# Patient Record
Sex: Female | Born: 1935 | Race: White | Hispanic: No | Smoking: Former smoker
Health system: Southern US, Community
[De-identification: ages and names within clinical notes are randomized; demographics above are authoritative.]

## PROBLEM LIST (undated history)

## (undated) DIAGNOSIS — E7800 Pure hypercholesterolemia, unspecified: Secondary | ICD-10-CM

## (undated) DIAGNOSIS — Z720 Tobacco use: Secondary | ICD-10-CM

## (undated) DIAGNOSIS — Z716 Tobacco abuse counseling: Secondary | ICD-10-CM

## (undated) DIAGNOSIS — K5792 Diverticulitis of intestine, part unspecified, without perforation or abscess without bleeding: Secondary | ICD-10-CM

## (undated) DIAGNOSIS — I10 Essential (primary) hypertension: Secondary | ICD-10-CM

## (undated) DIAGNOSIS — C801 Malignant (primary) neoplasm, unspecified: Secondary | ICD-10-CM

## (undated) DIAGNOSIS — K922 Gastrointestinal hemorrhage, unspecified: Secondary | ICD-10-CM

## (undated) HISTORY — DX: Pure hypercholesterolemia, unspecified: E78.00

## (undated) HISTORY — DX: Tobacco use: Z72.0

## (undated) HISTORY — DX: Gastrointestinal hemorrhage, unspecified: K92.2

## (undated) HISTORY — PX: APPENDECTOMY: SHX54

## (undated) HISTORY — DX: Essential (primary) hypertension: I10

## (undated) HISTORY — DX: Tobacco abuse counseling: Z71.6

## (undated) HISTORY — DX: Diverticulitis of intestine, part unspecified, without perforation or abscess without bleeding: K57.92

---

## 2001-08-15 HISTORY — PX: INCONTINENCE SURGERY: SHX676

## 2003-08-16 DIAGNOSIS — K5792 Diverticulitis of intestine, part unspecified, without perforation or abscess without bleeding: Secondary | ICD-10-CM

## 2003-08-16 HISTORY — DX: Diverticulitis of intestine, part unspecified, without perforation or abscess without bleeding: K57.92

## 2004-09-23 ENCOUNTER — Ambulatory Visit: Payer: Self-pay | Admitting: Internal Medicine

## 2004-09-29 ENCOUNTER — Ambulatory Visit: Payer: Self-pay | Admitting: Internal Medicine

## 2006-05-30 ENCOUNTER — Other Ambulatory Visit: Payer: Self-pay

## 2006-05-30 ENCOUNTER — Emergency Department: Payer: Self-pay | Admitting: Emergency Medicine

## 2006-06-20 ENCOUNTER — Emergency Department: Payer: Self-pay | Admitting: Emergency Medicine

## 2008-05-01 ENCOUNTER — Other Ambulatory Visit: Payer: Self-pay

## 2008-05-01 ENCOUNTER — Inpatient Hospital Stay: Payer: Self-pay | Admitting: Internal Medicine

## 2008-06-15 DIAGNOSIS — K922 Gastrointestinal hemorrhage, unspecified: Secondary | ICD-10-CM

## 2008-06-15 HISTORY — DX: Gastrointestinal hemorrhage, unspecified: K92.2

## 2008-07-09 ENCOUNTER — Ambulatory Visit: Payer: Self-pay | Admitting: Unknown Physician Specialty

## 2009-05-13 ENCOUNTER — Ambulatory Visit: Payer: Self-pay | Admitting: Internal Medicine

## 2009-08-15 HISTORY — PX: CATARACT EXTRACTION: SUR2

## 2009-09-09 ENCOUNTER — Ambulatory Visit: Payer: Self-pay | Admitting: Ophthalmology

## 2010-05-06 LAB — HM PAP SMEAR: HM Pap smear: NORMAL

## 2010-06-16 ENCOUNTER — Ambulatory Visit: Payer: Self-pay | Admitting: Ophthalmology

## 2011-02-02 ENCOUNTER — Ambulatory Visit: Payer: Self-pay | Admitting: Internal Medicine

## 2011-04-25 ENCOUNTER — Telehealth: Payer: Self-pay | Admitting: Internal Medicine

## 2011-04-25 NOTE — Telephone Encounter (Signed)
Again, I have no records to review on her so I don't know what medications she is taking , but she should start taking a full strength aspirin daily.

## 2011-04-25 NOTE — Telephone Encounter (Signed)
Patient called and stated she had two episodes of "chest pressure"  Last Tuesday and then again on Saturday.  She stated it doesn't feel like heartburn, or an upper respiratory infection.  And she did not report any pain in her arms or anywhere else she just wanted to see you.  The soonest appt you have is on Thursday.  I advised patient to go to the ER if it happened again.  Is there anything she can do until the appt?  Please advise

## 2011-04-25 NOTE — Telephone Encounter (Signed)
Notified patient he do not have her records.  She stated she is not supposed to be taking any aspirin because of a bleeding ulcer.  I advised her again we did not have her records so keep the appt on Thursday.

## 2011-04-26 NOTE — Telephone Encounter (Signed)
There is nothing else I can prescribe without seeing her. She needs to go to the ER if she has another episode

## 2011-04-27 NOTE — Telephone Encounter (Signed)
Notified patient of the message, she will keep appt with you tomorrow.

## 2011-04-28 ENCOUNTER — Encounter: Payer: Self-pay | Admitting: Internal Medicine

## 2011-04-28 ENCOUNTER — Other Ambulatory Visit: Payer: Self-pay | Admitting: Cardiovascular Disease

## 2011-04-28 ENCOUNTER — Ambulatory Visit: Payer: Self-pay | Admitting: Internal Medicine

## 2011-04-28 ENCOUNTER — Ambulatory Visit (INDEPENDENT_AMBULATORY_CARE_PROVIDER_SITE_OTHER): Payer: Medicare Other | Admitting: Internal Medicine

## 2011-04-28 DIAGNOSIS — K5900 Constipation, unspecified: Secondary | ICD-10-CM

## 2011-04-28 DIAGNOSIS — R079 Chest pain, unspecified: Secondary | ICD-10-CM | POA: Insufficient documentation

## 2011-04-28 DIAGNOSIS — O169 Unspecified maternal hypertension, unspecified trimester: Secondary | ICD-10-CM

## 2011-04-28 DIAGNOSIS — Z720 Tobacco use: Secondary | ICD-10-CM

## 2011-04-28 DIAGNOSIS — K5790 Diverticulosis of intestine, part unspecified, without perforation or abscess without bleeding: Secondary | ICD-10-CM | POA: Insufficient documentation

## 2011-04-28 DIAGNOSIS — F411 Generalized anxiety disorder: Secondary | ICD-10-CM

## 2011-04-28 DIAGNOSIS — F17200 Nicotine dependence, unspecified, uncomplicated: Secondary | ICD-10-CM

## 2011-04-28 DIAGNOSIS — Z716 Tobacco abuse counseling: Secondary | ICD-10-CM

## 2011-04-28 DIAGNOSIS — M810 Age-related osteoporosis without current pathological fracture: Secondary | ICD-10-CM | POA: Insufficient documentation

## 2011-04-28 DIAGNOSIS — E785 Hyperlipidemia, unspecified: Secondary | ICD-10-CM

## 2011-04-28 DIAGNOSIS — K922 Gastrointestinal hemorrhage, unspecified: Secondary | ICD-10-CM | POA: Insufficient documentation

## 2011-04-28 DIAGNOSIS — H409 Unspecified glaucoma: Secondary | ICD-10-CM

## 2011-04-28 DIAGNOSIS — Z7189 Other specified counseling: Secondary | ICD-10-CM

## 2011-04-28 DIAGNOSIS — I10 Essential (primary) hypertension: Secondary | ICD-10-CM | POA: Insufficient documentation

## 2011-04-28 DIAGNOSIS — K5730 Diverticulosis of large intestine without perforation or abscess without bleeding: Secondary | ICD-10-CM

## 2011-04-28 LAB — LIPID PANEL: HDL: 38.5 mg/dL — ABNORMAL LOW (ref >39.00)

## 2011-04-28 LAB — COMPREHENSIVE METABOLIC PANEL
Albumin: 4.2 g/dL (ref 3.5–5.2)
BUN: 12 mg/dL (ref 6–23)
Calcium: 9.4 mg/dL (ref 8.4–10.5)
Chloride: 104 mEq/L (ref 96–112)
Glucose, Bld: 111 mg/dL — ABNORMAL HIGH (ref 70–99)
Potassium: 4.9 mEq/L (ref 3.5–5.1)

## 2011-04-28 MED ORDER — METOPROLOL TARTRATE 25 MG PO TABS: 25.0000 mg | ORAL_TABLET | Freq: Two times a day (BID) | ORAL | Status: DC

## 2011-04-28 NOTE — Patient Instructions (Signed)
Your chest pain is concerning for angina (heart pain).  I am prescribing a medicine called metoprolol to calm your heat down.  It will not relieve your pain.  If you have another episode you need to go to the ER.  But we are referring you to Dr. Mariah Milling for a cardiac test.

## 2011-04-28 NOTE — Progress Notes (Signed)
  Subjective:    Patient ID: Nancy Doyle, female    DOB: 09-29-1934, 76 y.o.   MRN: [id number]  HPI  [name] is a  76 yo w female with history of hyperlipidemia, htn and tobacco abuse presents with 2 discrete epsiodes of chest pressure, substernal,  lasting 20 minutes, both occurring with minimal exertion .  She denies dypsnea, diaphoresis, nausea or jaw pain. She had a stress test less than 10 yrs ago, an exercise treadmill, which was negative.    Review of Systems  Constitutional: Negative for fever, chills and unexpected weight change.  HENT: Negative for hearing loss, ear pain, nosebleeds, congestion, sore throat, facial swelling, rhinorrhea, sneezing, mouth sores, trouble swallowing, neck pain, neck stiffness, voice change, postnasal drip, sinus pressure, tinnitus and ear discharge.   Eyes: Negative for pain, discharge, redness and visual disturbance.  Respiratory: Negative for cough, chest tightness, shortness of breath, wheezing and stridor.   Cardiovascular: Negative for chest pain, palpitations and leg swelling.  Musculoskeletal: Negative for myalgias and arthralgias.  Skin: Negative for color change and rash.  Neurological: Negative for dizziness, weakness, light-headedness and headaches.  Hematological: Negative for adenopathy.       Objective:   Physical Exam  Constitutional: She is oriented to person, place, and time. She appears well-developed and well-nourished.  HENT:  Mouth/Throat: Oropharynx is clear and moist.  Eyes: EOM are normal. Pupils are equal, round, and reactive to light. No scleral icterus.  Neck: Normal range of motion. Neck supple. No JVD present. No thyromegaly present.  Cardiovascular: Normal rate, regular rhythm, normal heart sounds and intact distal pulses.   Pulmonary/Chest: Effort normal and breath sounds normal.  Abdominal: Soft. Bowel sounds are normal. She exhibits no mass. There is no tenderness.  Musculoskeletal: Normal range of motion. She  exhibits no edema.  Lymphadenopathy:    She has no cervical adenopathy.  Neurological: She is alert and oriented to person, place, and time.  Skin: Skin is warm and dry.  Psychiatric: She has a normal mood and affect.          Assessment & Plan:

## 2011-04-29 ENCOUNTER — Telehealth: Payer: Self-pay | Admitting: *Deleted

## 2011-04-29 ENCOUNTER — Encounter: Payer: Self-pay | Admitting: Internal Medicine

## 2011-04-29 DIAGNOSIS — F411 Generalized anxiety disorder: Secondary | ICD-10-CM | POA: Insufficient documentation

## 2011-04-29 DIAGNOSIS — K5900 Constipation, unspecified: Secondary | ICD-10-CM | POA: Insufficient documentation

## 2011-04-29 DIAGNOSIS — Z716 Tobacco abuse counseling: Secondary | ICD-10-CM | POA: Insufficient documentation

## 2011-04-29 DIAGNOSIS — Z720 Tobacco use: Secondary | ICD-10-CM | POA: Insufficient documentation

## 2011-04-29 DIAGNOSIS — H409 Unspecified glaucoma: Secondary | ICD-10-CM | POA: Insufficient documentation

## 2011-04-29 LAB — TROPONIN I: Troponin I: <0.01 ng/mL (ref <0.06)

## 2011-04-29 LAB — CK TOTAL AND CKMB (NOT AT ARMC): Total CK: 50 U/L (ref 7–177)

## 2011-04-29 NOTE — Assessment & Plan Note (Signed)
Discussed (again) her  Increased risks for CAD, CVA and CA with continued abuse.  At her age she remains uninterested in cessation.

## 2011-04-29 NOTE — Assessment & Plan Note (Signed)
Concerning for new onset angina.  Cardiac RFs include age, hypertension, tobacco abuse and untreated hyperliidemia per patient preference. Have discussed case with Dossie Arbour who will see her next week and stressher at [facility] prior to meeting.  She had a negative stress test 5 yrs ago by Dr. Welton Flakes, but records are unavailable at this time.  Starting a beta blocker.  I did not give her an rx SL NTG bc I she has RFs for AAA and has no prior workup for it per patient preference.  Instructed to go directl to ER for next occurrence of chest pain so she can be admitted for unstable angina.

## 2011-04-29 NOTE — Telephone Encounter (Signed)
Pt previously seen by Dr. Darrick Huntsman, per Dr. Mariah Milling, gated [facility] ordered at [facility]. I have notified pt procedure scheduled for Tues 05/03/11 @ 0700, pt will pre-register no later than day before. Gave instructions: NPO 4 hrs prior, no caffeine or decaf 12 hrs prior to test, pt will HOLD Amlodipine and Metoprolol evening prior and morning of test, but will take remaining Rx meds with sip of water that AM. Pt has no other questions at this time. Notified pt we will call her with results within 1-2 days after procedure.

## 2011-05-02 ENCOUNTER — Telehealth: Payer: Self-pay | Admitting: Internal Medicine

## 2011-05-02 NOTE — Telephone Encounter (Signed)
Patient called and stated the metoprolol you prescribed at her visit made her very sick she experienced vomiting the first time she took the medication.  She has not taken it since then, because she stated it also made her feel worse than she did before.  She goes for her stress test tomorrow.  Please advise if you want to change the medication.

## 2011-05-03 ENCOUNTER — Ambulatory Visit: Payer: Self-pay | Admitting: Cardiovascular Disease

## 2011-05-03 DIAGNOSIS — R079 Chest pain, unspecified: Secondary | ICD-10-CM

## 2011-05-03 NOTE — Telephone Encounter (Signed)
Tried calling patient x 3, but kept getting busy signal. Will try again later.

## 2011-05-03 NOTE — Telephone Encounter (Signed)
The stress test was done today and looked good! Normal EF. No large regions of ischemia thx Tim

## 2011-05-03 NOTE — Telephone Encounter (Signed)
No, that is un unusua reaction.  Do not take anymore.

## 2011-05-04 ENCOUNTER — Telehealth: Payer: Self-pay | Admitting: *Deleted

## 2011-05-04 ENCOUNTER — Other Ambulatory Visit (INDEPENDENT_AMBULATORY_CARE_PROVIDER_SITE_OTHER): Payer: Medicare Other | Admitting: *Deleted

## 2011-05-04 ENCOUNTER — Telehealth: Payer: Self-pay | Admitting: Internal Medicine

## 2011-05-04 DIAGNOSIS — N390 Urinary tract infection, site not specified: Secondary | ICD-10-CM

## 2011-05-04 LAB — POCT URINALYSIS DIPSTICK
Glucose, UA: NEGATIVE
Protein, UA: NEGATIVE
Urobilinogen, UA: 0.2

## 2011-05-04 NOTE — Telephone Encounter (Signed)
Pt thinks she has a uti or kidney infection.  She goes to the bathroom a lot and it hurts [facility] [place]

## 2011-05-04 NOTE — Telephone Encounter (Signed)
Her stress test was normal per Dr. Mariah Milling

## 2011-05-04 NOTE — Telephone Encounter (Signed)
Patient scheduled a lab appt for possible uti. I entered results in her chart. There was a large amount of blood in her urine so I also sent for culture.

## 2011-05-04 NOTE — Telephone Encounter (Signed)
Pt notified that Lexiscan was normal per Dr. Mariah Milling. She has f/u in office 05/06/11.

## 2011-05-05 ENCOUNTER — Other Ambulatory Visit: Payer: Self-pay | Admitting: *Deleted

## 2011-05-05 MED ORDER — CIPROFLOXACIN HCL 250 MG PO TABS: 250.0000 mg | ORAL_TABLET | Freq: Two times a day (BID) | ORAL | Status: AC

## 2011-05-05 NOTE — Telephone Encounter (Signed)
Patient was notified by Dr. Windell Hummingbird office of her stress test results

## 2011-05-05 NOTE — Telephone Encounter (Signed)
Called to verify which pharmacy she wanted cipro called in to.

## 2011-05-06 ENCOUNTER — Encounter: Payer: Self-pay | Admitting: Cardiovascular Disease

## 2011-05-06 ENCOUNTER — Ambulatory Visit (INDEPENDENT_AMBULATORY_CARE_PROVIDER_SITE_OTHER): Payer: Medicare Other | Admitting: Cardiovascular Disease

## 2011-05-06 DIAGNOSIS — Z720 Tobacco use: Secondary | ICD-10-CM

## 2011-05-06 DIAGNOSIS — E785 Hyperlipidemia, unspecified: Secondary | ICD-10-CM

## 2011-05-06 DIAGNOSIS — F17200 Nicotine dependence, unspecified, uncomplicated: Secondary | ICD-10-CM

## 2011-05-06 DIAGNOSIS — R079 Chest pain, unspecified: Secondary | ICD-10-CM

## 2011-05-06 NOTE — Assessment & Plan Note (Signed)
We did discuss her smoking with her. She is not interested in any medication options for smoking cessation at this time. She is not mentally ready. She knows the dangers including cancer, stroke and heart attack.

## 2011-05-06 NOTE — Assessment & Plan Note (Addendum)
We have suggested that if she has any further episodes of chest pain, that she contact our office or Dr. Darrick Huntsman. If symptoms are concerning, escalating, increasing in intensity, we would recommend a cardiac catheterization given her long smoking history. We did suggest that she consider starting aspirin 81 mg times one. She will discuss this with Dr. Darrick Huntsman. She does have a history of peptic ulcer disease/GERD.

## 2011-05-06 NOTE — Progress Notes (Signed)
Patient ID: Nancy Doyle, female    DOB: 1935-02-13, 76 y.o.   MRN: [id number]  HPI Comments: Ms. Ciaramitaro is a pleasant 76 year old woman with a long history of smoking "since she was 76 years old", hyperlipidemia, family history of stroke, with recent episodes of chest pain, workup including a Myoview stress test that showed no significant ischemia and normal ejection fraction. She presents to establish care in our office and for followup evaluation.  She reports that she has not had any further chest pain. She does not have shortness of breath, is active at baseline. Overall has no complaints. She did have occasional swelling in her legs but this resolves with leg elevation. Her mother did have venous insufficiency. She is not interested in a cholesterol medication for uncertain reasons. She reports that her previous episodes of chest pain happened while she was at rest.  She was unable to tolerate metoprolol as this caused nausea and malaise. She continues to smoke one pack per day.  EKG shows normal sinus rhythm with rate 80 beats per minute with no significant ST or T wave changes  Recent stress test performed at [facility] showed no significant ischemia   Outpatient Encounter Prescriptions as of 05/06/2011  Medication Sig Dispense Refill  . ALPRAZolam (XANAX) 0.5 MG tablet Take 0.5 mg by mouth 2 (two) times daily as needed.        Marland Kitchen amLODipine (NORVASC) 10 MG tablet Take 10 mg by mouth daily.        . Calcium Carbonate-Vitamin D (CALCIUM + D) 600-200 MG-UNIT TABS Take 1 tablet by mouth daily.        . ciprofloxacin (CIPRO) 250 MG tablet Take 1 tablet (250 mg total) by mouth 2 (two) times daily.  14 tablet  0  . multivitamin-iron-minerals-folic acid (CENTRUM) chewable tablet Chew 1 tablet by mouth daily.        Marland Kitchen omeprazole (PRILOSEC) 10 MG capsule Take 10 mg by mouth daily.        . polyethylene glycol (MIRALAX / GLYCOLAX) packet Take 17 g by mouth daily as needed.        . travoprost,  benzalkonium, (TRAVATAN) 0.004 % ophthalmic solution Place 1 drop into both eyes at bedtime.           Review of Systems  Constitutional: Negative.   HENT: Negative.   Eyes: Negative.   Respiratory: Negative.   Cardiovascular: Negative.   Gastrointestinal: Negative.   Musculoskeletal: Negative.   Skin: Negative.   Neurological: Negative.   Hematological: Negative.   Psychiatric/Behavioral: Negative.   All other systems reviewed and are negative.    BP 126/75  Pulse 84  Ht 5\' 6"  (1.676 m)  Wt 160 lb (72.576 kg)  BMI 25.82 kg/m2   Physical Exam  Nursing note and vitals reviewed. Constitutional: She is oriented to person, place, and time. She appears well-developed and well-nourished.  HENT:  Head: Normocephalic.  Nose: Nose normal.  Mouth/Throat: Oropharynx is clear and moist.  Eyes: Conjunctivae are normal. Pupils are equal, round, and reactive to light.  Neck: Normal range of motion. Neck supple. No JVD present.  Cardiovascular: Normal rate, regular rhythm, S1 normal, S2 normal, normal heart sounds and intact distal pulses.  Exam reveals no gallop and no friction rub.   No murmur heard. Pulmonary/Chest: Effort normal and breath sounds normal. No respiratory distress. She has no wheezes. She has no rales. She exhibits no tenderness.  Abdominal: Soft. Bowel sounds are normal. She exhibits no distension.  There is no tenderness.  Musculoskeletal: Normal range of motion. She exhibits no edema and no tenderness.  Lymphadenopathy:    She has no cervical adenopathy.  Neurological: She is alert and oriented to person, place, and time. Coordination normal.  Skin: Skin is warm and dry. No rash noted. No erythema.  Psychiatric: She has a normal mood and affect. Her behavior is normal. Judgment and thought content normal.         Assessment and Plan

## 2011-05-06 NOTE — Assessment & Plan Note (Signed)
We did discuss her cholesterol with her. She does not want a cholesterol medication at this time. We did suggest she try red yeast rice, 2 tablets, possibly titrating to 4 tablets a day.

## 2011-05-06 NOTE — Patient Instructions (Signed)
You are doing well. Consider trying red yeast rice. Consider taking aspirin 81 mg daily Please call us if you have new issues that need to be addressed before your next appt.  We will call you for a follow up Appt. In 12 months

## 2011-05-07 LAB — URINE CULTURE

## 2011-05-12 ENCOUNTER — Encounter: Payer: Self-pay | Admitting: Cardiovascular Disease

## 2011-05-27 ENCOUNTER — Encounter: Payer: Self-pay | Admitting: Internal Medicine

## 2011-08-19 ENCOUNTER — Telehealth: Payer: Self-pay | Admitting: Internal Medicine

## 2011-08-19 DIAGNOSIS — Z1239 Encounter for other screening for malignant neoplasm of breast: Secondary | ICD-10-CM

## 2011-08-19 NOTE — Telephone Encounter (Signed)
Should be on printer 

## 2011-08-19 NOTE — Telephone Encounter (Signed)
[phone] Pt called wanted to get order for mammogram  She goes to [facility] Its been over a year.   Pt would like after 9 appointment

## 2011-08-22 ENCOUNTER — Other Ambulatory Visit: Payer: Self-pay | Admitting: *Deleted

## 2011-08-22 DIAGNOSIS — O169 Unspecified maternal hypertension, unspecified trimester: Secondary | ICD-10-CM

## 2011-08-22 MED ORDER — AMLODIPINE BESYLATE 10 MG PO TABS: 10.0000 mg | ORAL_TABLET | Freq: Every day | ORAL | Status: DC

## 2011-08-22 MED ORDER — HYDROCODONE-ACETAMINOPHEN 7.5-750 MG PO TABS: ORAL_TABLET | ORAL | Status: DC

## 2011-08-22 NOTE — Telephone Encounter (Signed)
Ok to refill vicodin  #30 with 3 refills

## 2011-08-22 NOTE — Telephone Encounter (Signed)
Medication called to pharmacy. 

## 2011-08-22 NOTE — Telephone Encounter (Signed)
Nancy Doyle is setting up this mammogram for patient.

## 2011-08-22 NOTE — Telephone Encounter (Signed)
Pt requests refill on hydrocodone, [facility] [place].

## 2011-10-06 ENCOUNTER — Ambulatory Visit: Payer: Self-pay | Admitting: Internal Medicine

## 2011-10-24 ENCOUNTER — Encounter: Payer: Self-pay | Admitting: Internal Medicine

## 2011-10-31 ENCOUNTER — Other Ambulatory Visit: Payer: Self-pay | Admitting: Internal Medicine

## 2011-10-31 DIAGNOSIS — F411 Generalized anxiety disorder: Secondary | ICD-10-CM

## 2011-10-31 NOTE — Telephone Encounter (Signed)
[phone] Pt called to get her alprazolam refilled pt is out of meds [facility] [place]

## 2011-11-02 MED ORDER — ALPRAZOLAM 0.5 MG PO TABS: 0.5000 mg | ORAL_TABLET | Freq: Two times a day (BID) | ORAL | Status: DC | PRN

## 2011-11-25 ENCOUNTER — Telehealth: Payer: Self-pay | Admitting: Internal Medicine

## 2011-11-25 NOTE — Telephone Encounter (Signed)
Patient notified

## 2011-11-25 NOTE — Telephone Encounter (Signed)
Caller: Opie/Patient; PCP: Duncan Dull; CB#: [phone];  Call regarding feeling like she is going to pass out and sweating.; Pt felting faint after mowing on 11/28/11. Had to lay her head down on the table for 30 mins, unsure if she had LOC. She had a similar episode x 2 wks ago that occured while sitting. Felt faint and flushed, no LOC. Pt is asymptomatic today. Appt sched for 11/29/11 @ 1000 with Dr. Darrick Huntsman per Fainting Protocol. Call back parameters reviewed.

## 2011-11-25 NOTE — Telephone Encounter (Signed)
Please ackmnowledge patients appt for 4/16 and tell her to avoid strenuous activity (no more mowing) and to increase her fluid intake. NOt sure from the triage note when the sympomts occurred in relation to mowing (4/15?? That date is in the future) so she might be dehydrated.   If it happens again before th 16th she should call 911

## 2011-11-29 ENCOUNTER — Encounter: Payer: Self-pay | Admitting: Internal Medicine

## 2011-11-29 ENCOUNTER — Ambulatory Visit (INDEPENDENT_AMBULATORY_CARE_PROVIDER_SITE_OTHER): Payer: Medicare Other | Admitting: Internal Medicine

## 2011-11-29 VITALS — BP 130/66 | HR 75 | Temp 98.4°F | Resp 18 | Wt 157.8 lb

## 2011-11-29 DIAGNOSIS — Z720 Tobacco use: Secondary | ICD-10-CM

## 2011-11-29 DIAGNOSIS — E039 Hypothyroidism, unspecified: Secondary | ICD-10-CM

## 2011-11-29 DIAGNOSIS — R55 Syncope and collapse: Secondary | ICD-10-CM

## 2011-11-29 DIAGNOSIS — K922 Gastrointestinal hemorrhage, unspecified: Secondary | ICD-10-CM

## 2011-11-29 DIAGNOSIS — F17200 Nicotine dependence, unspecified, uncomplicated: Secondary | ICD-10-CM

## 2011-11-29 LAB — CBC WITH DIFFERENTIAL/PLATELET
Basophils Absolute: 0 10*3/uL (ref 0.0–0.1)
Hemoglobin: 14.2 g/dL (ref 12.0–15.0)
Lymphocytes Relative: 29.2 % (ref 12.0–46.0)
Monocytes Relative: 14.8 % — ABNORMAL HIGH (ref 3.0–12.0)
Neutro Abs: 3.8 10*3/uL (ref 1.4–7.7)
RBC: 4.64 Mil/uL (ref 3.87–5.11)
RDW: 15.4 % — ABNORMAL HIGH (ref 11.5–14.6)

## 2011-11-29 LAB — COMPLETE METABOLIC PANEL WITH GFR
ALT: 8 U/L (ref 0–35)
Alkaline Phosphatase: 86 U/L (ref 39–117)
Creat: 0.66 mg/dL (ref 0.50–1.10)
GFR, Est African American: >89 mL/min
GFR, Est Non African American: 87 mL/min
Glucose, Bld: 92 mg/dL (ref 70–99)
Sodium: 140 mEq/L (ref 135–145)
Total Bilirubin: 0.4 mg/dL (ref 0.3–1.2)
Total Protein: 7.2 g/dL (ref 6.0–8.3)

## 2011-11-29 LAB — TSH: TSH: 1.79 u[IU]/mL (ref 0.35–5.50)

## 2011-11-29 NOTE — Patient Instructions (Signed)
You appear to be dehydrated,  Which  can cause you to faint.  Please increase your water and salt intaek for the next few days until we can result your labs.  The next time you feel this way,  Lie down immediately, or call 911 if you are having chest pain or shortness of breath with it

## 2011-11-29 NOTE — Progress Notes (Signed)
Patient ID: Nancy Doyle, female   DOB: 1935-04-21, 77 y.o.   MRN: [id number]  Patient Active Problem List  Diagnoses  . Chest pain  . Other and unspecified hyperlipidemia  . Hypertension in antepartum pregnancy  . Diverticulosis  . Upper GI bleed  . Osteoporosis  . Tobacco abuse  . Tobacco abuse counseling  . Constipation  . Generalized anxiety disorder  . Glaucoma  . Near syncope    Subjective:  CC:   Chief Complaint  Patient presents with  . Loss of Consciousness    2 episodes, almost passed out    HPI:   Nancy Doyle a 77 y.o. female who presents  Past Medical History  Diagnosis Date  . GI bleed Nov 2009    3  ulcers found, required transfusion, repeat endoscopy showed mild gastritis  . Hypercholesterolemia     refuses statin therapy  . Diverticulitis 2005    hospitalized  . Hypertension   . Osteoporosis   . Tobacco abuse   . Tobacco abuse counseling     Past Surgical History  Procedure Date  . Incontinence surgery 2003    Dr. Barnabas Lister  . Appendectomy Age 23  . Cataract extraction Jan 2011    Dr. Inez Pilgrim         The following portions of the patient's history were reviewed and updated as appropriate: Allergies, current medications, and problem list.    Review of Systems:   12 Pt  review of systems was negative except those addressed in the HPI,     History   Social History  . Marital Status: Single    Spouse Name: N/A    Number of Children: N/A  . Years of Education: N/A   Occupational History  . Retired    Social History Main Topics  . Smoking status: Current Everyday Smoker -- 1.0 packs/day    Types: Cigarettes  . Smokeless tobacco: Never Used  . Alcohol Use: No  . Drug Use: No  . Sexually Active: Not on file   Other Topics Concern  . Not on file   Social History Narrative  . No narrative on file    Objective:  BP 130/66  Pulse 75  Temp(Src) 98.4 F (36.9 C) (Oral)  Resp 18  Wt 157 lb 12 oz  (71.555 kg)  SpO2 98% sbP 150 drops to 130 standing  General appearance: alert, cooperative and appears stated age    Ears: normal TM's and external ear canals both ears Throat: lips, mucosa, and tongue normal; teeth and gums normal Neck: no adenopathy, no carotid bruit, supple, symmetrical, trachea midline and thyroid not enlarged, symmetric, no tenderness/mass/nodules Back: symmetric, no curvature. ROM normal. No CVA tenderness. Lungs: clear to auscultation bilaterally Heart: regular rate and rhythm, S1, S2 normal, no murmur, click, rub or gallop Abdomen: soft, non-tender; bowel sounds normal; no masses,  no organomegaly GI:  Stool brown, heme negative  Pulses: 2+ and symmetric Skin: Skin color, texture, turgor normal. No rashes or lesions Lymph nodes: Cervical, supraclavicular, and axillary nodes normal. Neuro: grossly nonfocal.   Assessment and Plan:  Near syncope She is mildly orthostatic on exam, with a systolic drop from 150 to 130.  FOBT was negative.  She has a history of asymptomatic gastric ulcers leading to symptomatic anemia , so I will screen for anemia and dehydration today .  If labs are normal will need to repeat ECHO to rule out aortic stenosis    Tobacco abuse Risks of continued tobacco use  were discussed. She is not currently interested in tobacco cessation.     Updated Medication List Outpatient Encounter Prescriptions as of 11/29/2011  Medication Sig Dispense Refill  . ALPRAZolam (XANAX) 0.5 MG tablet Take 1 tablet (0.5 mg total) by mouth 2 (two) times daily as needed.  60 tablet  3  . amLODipine (NORVASC) 10 MG tablet Take 1 tablet (10 mg total) by mouth daily.  30 tablet  9  . Calcium Carbonate-Vitamin D (CALCIUM + D) 600-200 MG-UNIT TABS Take 1 tablet by mouth daily.        Marland Kitchen HYDROcodone-acetaminophen (VICODIN ES) 7.5-750 MG per tablet Take one tablet by mouth at bedtime as needed for severe pain.  30 tablet  3  . multivitamin-iron-minerals-folic acid  (CENTRUM) chewable tablet Chew 1 tablet by mouth daily.        Marland Kitchen omeprazole (PRILOSEC) 10 MG capsule Take 10 mg by mouth daily.        . polyethylene glycol (MIRALAX / GLYCOLAX) packet Take 17 g by mouth daily as needed.        . travoprost, benzalkonium, (TRAVATAN) 0.004 % ophthalmic solution Place 1 drop into both eyes at bedtime.           Orders Placed This Encounter  Procedures  . CBC with Differential  . TSH  . COMPLETE METABOLIC PANEL WITH GFR  . Troponin I    No Follow-up on file.

## 2011-12-01 ENCOUNTER — Encounter: Payer: Self-pay | Admitting: Internal Medicine

## 2011-12-01 NOTE — Assessment & Plan Note (Addendum)
Risks of continued tobacco use were discussed. She is not currently interested in tobacco cessation.    

## 2011-12-01 NOTE — Assessment & Plan Note (Signed)
She is mildly orthostatic on exam, with a systolic drop from 150 to 130.  FOBT was negative.  She has a history of asymptomatic gastric ulcers leading to symptomatic anemia , so I will screen for anemia and dehydration today .  If labs are normal will need to repeat ECHO to rule out aortic stenosis

## 2012-03-13 ENCOUNTER — Other Ambulatory Visit: Payer: Self-pay | Admitting: *Deleted

## 2012-03-14 ENCOUNTER — Other Ambulatory Visit: Payer: Self-pay | Admitting: Internal Medicine

## 2012-03-14 MED ORDER — TRAMADOL HCL 50 MG PO TABS: 50.0000 mg | ORAL_TABLET | Freq: Four times a day (QID) | ORAL | Status: AC | PRN

## 2012-03-14 MED ORDER — TRAMADOL HCL 50 MG PO TABS: 50.0000 mg | ORAL_TABLET | Freq: Three times a day (TID) | ORAL | Status: DC | PRN

## 2012-03-14 MED ORDER — HYDROCODONE-ACETAMINOPHEN 7.5-750 MG PO TABS: ORAL_TABLET | ORAL | Status: DC

## 2012-05-02 ENCOUNTER — Other Ambulatory Visit: Payer: Self-pay | Admitting: Internal Medicine

## 2012-05-02 DIAGNOSIS — F411 Generalized anxiety disorder: Secondary | ICD-10-CM

## 2012-05-02 MED ORDER — ALPRAZOLAM 0.5 MG PO TABS: 0.5000 mg | ORAL_TABLET | Freq: Two times a day (BID) | ORAL | Status: DC | PRN

## 2012-05-21 ENCOUNTER — Telehealth: Payer: Self-pay | Admitting: Internal Medicine

## 2012-05-21 NOTE — Telephone Encounter (Signed)
Refill request for amlodipine besylate 10 mg tab °Qty: 30 °Sig: take 1 tablet (10mg total) by mouth daily °

## 2012-06-07 ENCOUNTER — Other Ambulatory Visit: Payer: Self-pay

## 2012-06-07 DIAGNOSIS — O169 Unspecified maternal hypertension, unspecified trimester: Secondary | ICD-10-CM

## 2012-06-07 MED ORDER — AMLODIPINE BESYLATE 10 MG PO TABS: 10.0000 mg | ORAL_TABLET | Freq: Every day | ORAL | Status: DC

## 2012-06-07 NOTE — Telephone Encounter (Signed)
rx amlodipine 10 mg # 30 5 R sent electronic to [facility].

## 2012-06-07 NOTE — Telephone Encounter (Signed)
Ok to refill amlodipine 30 with 5 refills

## 2012-06-11 ENCOUNTER — Ambulatory Visit (INDEPENDENT_AMBULATORY_CARE_PROVIDER_SITE_OTHER): Payer: Medicare Other | Admitting: Internal Medicine

## 2012-06-11 ENCOUNTER — Ambulatory Visit: Payer: Self-pay | Admitting: Internal Medicine

## 2012-06-11 ENCOUNTER — Encounter: Payer: Self-pay | Admitting: Internal Medicine

## 2012-06-11 ENCOUNTER — Other Ambulatory Visit: Payer: Self-pay

## 2012-06-11 VITALS — BP 118/64 | HR 88 | Temp 98.7°F | Ht 65.0 in | Wt 153.2 lb

## 2012-06-11 DIAGNOSIS — F17200 Nicotine dependence, unspecified, uncomplicated: Secondary | ICD-10-CM

## 2012-06-11 DIAGNOSIS — J449 Chronic obstructive pulmonary disease, unspecified: Secondary | ICD-10-CM

## 2012-06-11 DIAGNOSIS — R05 Cough: Secondary | ICD-10-CM

## 2012-06-11 DIAGNOSIS — Z7189 Other specified counseling: Secondary | ICD-10-CM

## 2012-06-11 DIAGNOSIS — Z716 Tobacco abuse counseling: Secondary | ICD-10-CM

## 2012-06-11 MED ORDER — DOXYCYCLINE HYCLATE 50 MG PO CAPS: 50.0000 mg | ORAL_CAPSULE | Freq: Two times a day (BID) | ORAL | Status: DC

## 2012-06-11 MED ORDER — PREDNISONE 10 MG PO TABS: ORAL_TABLET | ORAL | Status: DC

## 2012-06-11 MED ORDER — GUAIFENESIN-CODEINE 100-10 MG/5ML PO SYRP: 5.0000 mL | ORAL_SOLUTION | Freq: Three times a day (TID) | ORAL | Status: DC | PRN

## 2012-06-11 NOTE — Progress Notes (Signed)
Patient ID: Nancy Doyle, female   DOB: [birth year], 77 y.o.   MRN: [id number] Patient Active Problem List  Diagnosis  . Chest pain  . Other and unspecified hyperlipidemia  . Hypertension in antepartum pregnancy  . Diverticulosis  . Upper GI bleed  . Osteoporosis  . Tobacco abuse  . Tobacco abuse counseling  . Constipation  . Generalized anxiety disorder  . Glaucoma  . Near syncope  . Bronchitis with airway obstruction    Subjective:  CC:   Chief Complaint  Patient presents with  . Cough    HPI:   Nancy Doyle a 77 y.o. female who presents Nonproductive cough for the last 4 weeks. Patient states her symptoms started as a cold. She denies chest pain dyspnea but has noticed occasional wheezing when she inhales to to cough. Cough is not made worse by activity eating or lying down. It is more noticeable at rest. She's not having an unintentional weight loss or night sweats. She has not been taking anything over-the-counter because "it never works.". She continues to smoke about half to three-quarter packs of cigarettes daily. She has declined flu like seen historically and today.    Past Medical History  Diagnosis Date  . GI bleed Nov 2009    3  ulcers found, required transfusion, repeat endoscopy showed mild gastritis  . Hypercholesterolemia     refuses statin therapy  . Diverticulitis 2005    hospitalized  . Hypertension   . Osteoporosis   . Tobacco abuse   . Tobacco abuse counseling     Past Surgical History  Procedure Date  . Incontinence surgery 2003    Dr. Barnabas Lister  . Appendectomy Age 84  . Cataract extraction Jan 2011    Dr. Inez Pilgrim         The following portions of the patient's history were reviewed and updated as appropriate: Allergies, current medications, and problem list.    Review of Systems:   12 Pt  review of systems was negative except those addressed in the HPI,     History   Social History  . Marital Status: Single    Spouse Name: N/A    Number of Children: N/A  . Years of Education: N/A   Occupational History  . Retired    Social History Main Topics  . Smoking status: Current Every Day Smoker -- 0.5 packs/day for 15 years    Types: Cigarettes  . Smokeless tobacco: Never Used  . Alcohol Use: No  . Drug Use: No  . Sexually Active: Not on file   Other Topics Concern  . Not on file   Social History Narrative  . No narrative on file    Objective:  BP 118/64  Pulse 88  Temp 98.7 F (37.1 C) (Oral)  Ht 5\' 5"  (1.651 m)  Wt 153 lb 4 oz (69.514 kg)  BMI 25.50 kg/m2  SpO2 94%  General appearance: alert, cooperative and appears stated age Ears: right TM with serous effusion,  No redness. Left TM normal Throat: lips, mucosa, and tongue normal; teeth and gums normal Neck: no adenopathy, no carotid bruit, supple, symmetrical, trachea midline and thyroid not enlarged, symmetric, no tenderness/mass/nodules Back: symmetric, no curvature. ROM normal. No CVA tenderness. Lungs: clear to auscultation bilaterally with inspiratory wheezes,  Light.  No egophony.  Heart: regular rate and rhythm, S1, S2 normal, no murmur, click, rub or gallop Abdomen: soft, non-tender; bowel sounds normal; no masses,  no organomegaly Pulses: 2+ and symmetric Skin: Skin color,  texture, turgor normal. No rashes or lesions Lymph nodes: Cervical, supraclavicular, and axillary nodes normal.  Assessment and Plan:  Tobacco abuse counseling The patient was advised to quit smoking; her risks of lung cancer, bladder ca, emphysema and breast cancer were  discussed.  Bronchitis with airway obstruction Chest x-ray , doxycycline, and  a six-day prednisone taper all ordered. Patient advised to quit smoking.    Updated Medication List Outpatient Encounter Prescriptions as of 06/11/2012  Medication Sig Dispense Refill  . ALPRAZolam (XANAX) 0.5 MG tablet Take 1 tablet (0.5 mg total) by mouth 2 (two) times daily as needed.  60 tablet   3  . amLODipine (NORVASC) 10 MG tablet Take 1 tablet (10 mg total) by mouth daily.  30 tablet  5  . Calcium Carbonate-Vitamin D (CALCIUM + D) 600-200 MG-UNIT TABS Take 1 tablet by mouth daily.        Marland Kitchen HYDROcodone-acetaminophen (VICODIN ES) 7.5-750 MG per tablet Take one tablet by mouth at bedtime as needed for severe pain.  30 tablet  3  . multivitamin-iron-minerals-folic acid (CENTRUM) chewable tablet Chew 1 tablet by mouth daily.        Marland Kitchen omeprazole (PRILOSEC) 10 MG capsule Take 10 mg by mouth daily.        . polyethylene glycol (MIRALAX / GLYCOLAX) packet Take 17 g by mouth daily as needed.        . travoprost, benzalkonium, (TRAVATAN) 0.004 % ophthalmic solution Place 1 drop into both eyes at bedtime.        Marland Kitchen doxycycline (VIBRAMYCIN) 50 MG capsule Take 1 capsule (50 mg total) by mouth 2 (two) times daily.  14 capsule  0  . guaiFENesin-codeine (ROBITUSSIN AC) 100-10 MG/5ML syrup Take 5 mLs by mouth 3 (three) times daily as needed for cough.  120 mL  0  . predniSONE (DELTASONE) 10 MG tablet 6 tablets on Day 1 , then reduce by 1 tablet daily until gone  21 tablet  0     Orders Placed This Encounter  Procedures  . DG Chest 2 View    No Follow-up on file.

## 2012-06-11 NOTE — Assessment & Plan Note (Signed)
The patient was advised to quit smoking; her risks of lung cancer, bladder ca, emphysema and breast cancer were  discussed.

## 2012-06-11 NOTE — Assessment & Plan Note (Signed)
Chest x-ray , doxycycline, and  a six-day prednisone taper all ordered. Patient advised to quit smoking.

## 2012-06-11 NOTE — Patient Instructions (Addendum)
You need to quit smoking  I am ordering a chest x ray because of your cough.  Please go to [place] office at [facility]  On [facility] [address] towards [facility]; take with food twice daily for infection Prednisone:  Tapering dose for 6 days for wheezing Cough syrup to suppress cough

## 2012-06-12 ENCOUNTER — Telehealth: Payer: Self-pay | Admitting: Internal Medicine

## 2012-06-12 NOTE — Telephone Encounter (Signed)
Lungs suggest COPD.  No pneumonia seen.  Please consider tobacco abstinence

## 2012-06-12 NOTE — Telephone Encounter (Signed)
COPD, no pneumonia on Chest x ray,.  Quit smoking

## 2012-06-13 ENCOUNTER — Telehealth: Payer: Self-pay | Admitting: Internal Medicine

## 2012-06-13 DIAGNOSIS — O169 Unspecified maternal hypertension, unspecified trimester: Secondary | ICD-10-CM

## 2012-06-13 MED ORDER — AMLODIPINE BESYLATE 10 MG PO TABS: 10.0000 mg | ORAL_TABLET | Freq: Every day | ORAL | Status: DC

## 2012-06-13 NOTE — Telephone Encounter (Signed)
Refill request for Amlodipine 10 mg # 30 5 R sent to [facility]

## 2012-06-13 NOTE — Telephone Encounter (Signed)
Patient notified

## 2012-06-13 NOTE — Telephone Encounter (Signed)
Refill request for amlodipine besylate 10 mg tab Qty: 30 Sig: take 1 tablet (10mg  total) by mouth daily

## 2012-06-22 ENCOUNTER — Encounter: Payer: Self-pay | Admitting: Internal Medicine

## 2012-09-04 ENCOUNTER — Ambulatory Visit (INDEPENDENT_AMBULATORY_CARE_PROVIDER_SITE_OTHER): Payer: Medicare Other | Admitting: Adult Health

## 2012-09-04 ENCOUNTER — Encounter: Payer: Self-pay | Admitting: Adult Health

## 2012-09-04 VITALS — BP 124/69 | HR 89 | Temp 98.6°F | Resp 14 | Ht 66.0 in | Wt 149.0 lb

## 2012-09-04 DIAGNOSIS — J449 Chronic obstructive pulmonary disease, unspecified: Secondary | ICD-10-CM

## 2012-09-04 DIAGNOSIS — J4489 Other specified chronic obstructive pulmonary disease: Secondary | ICD-10-CM

## 2012-09-04 MED ORDER — LEVOFLOXACIN 500 MG PO TABS: 500.0000 mg | ORAL_TABLET | Freq: Every day | ORAL | Status: DC

## 2012-09-04 NOTE — Patient Instructions (Addendum)
  Please start your antibiotic today.  Drink fluids to stay hydrated.  We will give you a breathing treatment prior to leaving the office today.  Please call if you do not feel any improvement within the next 3-4 days.

## 2012-09-04 NOTE — Assessment & Plan Note (Addendum)
Discussed chest xray with patient. She does not wish to have one done today. Albuterol nebulizer tx prior to leaving office today. Ordered Levaquin. Pt has some hydrocodone medication. She can take this for cough, mainly to help her sleep at night. RTC if no improvement within 3-4 days.

## 2012-09-04 NOTE — Progress Notes (Signed)
Subjective:    Patient ID: Nancy Doyle, female    DOB: 15-Dec-1934, 78 y.o.   MRN: [id number]  HPI  Patient is a pleasant 78 y/o female who presents to clinic with c/o cough "until my chest is sore", sinus pressure. Secretions from nose are clear. She has tried some OTC cough/cold remedy. She has been around some family members who have been sick. Patient comes in with her daughter-in-law, Nancy Doyle who is also sick. Patient denies fever, shortness of breath. She has had chills. She reports she stayed in bed all day yesterday.  Current Outpatient Prescriptions on File Prior to Visit  Medication Sig Dispense Refill  . ALPRAZolam (XANAX) 0.5 MG tablet Take 1 tablet (0.5 mg total) by mouth 2 (two) times daily as needed.  60 tablet  3  . amLODipine (NORVASC) 10 MG tablet Take 1 tablet (10 mg total) by mouth daily.  30 tablet  5  . Calcium Carbonate-Vitamin D (CALCIUM + D) 600-200 MG-UNIT TABS Take 1 tablet by mouth daily.        . multivitamin-iron-minerals-folic acid (CENTRUM) chewable tablet Chew 1 tablet by mouth daily.        Marland Kitchen omeprazole (PRILOSEC) 10 MG capsule Take 10 mg by mouth daily.        . polyethylene glycol (MIRALAX / GLYCOLAX) packet Take 17 g by mouth daily as needed.        . travoprost, benzalkonium, (TRAVATAN) 0.004 % ophthalmic solution Place 1 drop into both eyes at bedtime.        Marland Kitchen doxycycline (VIBRAMYCIN) 50 MG capsule Take 1 capsule (50 mg total) by mouth 2 (two) times daily.  14 capsule  0  . guaiFENesin-codeine (ROBITUSSIN AC) 100-10 MG/5ML syrup Take 5 mLs by mouth 3 (three) times daily as needed for cough.  120 mL  0  . HYDROcodone-acetaminophen (VICODIN ES) 7.5-750 MG per tablet Take one tablet by mouth at bedtime as needed for severe pain.  30 tablet  3  . predniSONE (DELTASONE) 10 MG tablet 6 tablets on Day 1 , then reduce by 1 tablet daily until gone  21 tablet  0     Review of Systems  Constitutional: Positive for fever, chills and fatigue.  HENT:  Positive for congestion, sore throat and rhinorrhea.   Respiratory: Positive for cough and wheezing. Negative for chest tightness.   Cardiovascular: Negative for chest pain.  Gastrointestinal: Negative.   Genitourinary: Negative.   Neurological: Positive for weakness.  Psychiatric/Behavioral: Negative.     BP 124/69  Pulse 89  Temp 98.6 F (37 C) (Oral)  Resp 14  Ht 5\' 6"  (1.676 m)  Wt 149 lb (67.586 kg)  BMI 24.05 kg/m2  SpO2 94%     Objective:   Physical Exam  Constitutional: She is oriented to person, place, and time. She appears well-developed and well-nourished. No distress.       Ill appearing  HENT:  Head: Normocephalic and atraumatic.  Eyes:       Bilateral eyes watery and slightly bloodshot.  Cardiovascular: Normal rate, regular rhythm and normal heart sounds.   Pulmonary/Chest: Effort normal. She has no wheezes.       Scattered rhonchi throughout lung fields.  Abdominal: Soft. Bowel sounds are normal.  Musculoskeletal: Normal range of motion.  Lymphadenopathy:    She has no cervical adenopathy.  Neurological: She is alert and oriented to person, place, and time.  Skin: Skin is warm and dry.  Psychiatric: She has a normal mood and  affect. Her behavior is normal. Judgment and thought content normal.          Assessment & Plan:

## 2012-11-07 ENCOUNTER — Other Ambulatory Visit: Payer: Self-pay | Admitting: *Deleted

## 2012-11-07 DIAGNOSIS — F411 Generalized anxiety disorder: Secondary | ICD-10-CM

## 2012-11-08 MED ORDER — ALPRAZOLAM 0.5 MG PO TABS: 0.5000 mg | ORAL_TABLET | Freq: Two times a day (BID) | ORAL | Status: DC | PRN

## 2012-11-08 NOTE — Telephone Encounter (Signed)
Med phoned in on 3/27

## 2012-11-08 NOTE — Telephone Encounter (Signed)
Ok to refill,  Authorized in [employer] in

## 2012-12-03 ENCOUNTER — Other Ambulatory Visit: Payer: Self-pay | Admitting: *Deleted

## 2012-12-03 ENCOUNTER — Encounter: Payer: Self-pay | Admitting: *Deleted

## 2012-12-03 MED ORDER — AMLODIPINE BESYLATE 10 MG PO TABS: 10.0000 mg | ORAL_TABLET | Freq: Every day | ORAL | Status: DC

## 2013-01-31 ENCOUNTER — Ambulatory Visit (INDEPENDENT_AMBULATORY_CARE_PROVIDER_SITE_OTHER): Payer: Medicare Other | Admitting: Adult Health

## 2013-01-31 ENCOUNTER — Encounter: Payer: Self-pay | Admitting: Adult Health

## 2013-01-31 VITALS — BP 124/68 | HR 84 | Temp 97.7°F | Resp 12 | Wt 157.0 lb

## 2013-01-31 DIAGNOSIS — K5730 Diverticulosis of large intestine without perforation or abscess without bleeding: Secondary | ICD-10-CM

## 2013-01-31 DIAGNOSIS — K5790 Diverticulosis of intestine, part unspecified, without perforation or abscess without bleeding: Secondary | ICD-10-CM

## 2013-01-31 MED ORDER — CIPROFLOXACIN HCL 500 MG PO TABS: 500.0000 mg | ORAL_TABLET | Freq: Two times a day (BID) | ORAL | Status: DC

## 2013-01-31 NOTE — Patient Instructions (Addendum)
  Start Cipro 500 mg twice daily for 7 days.  Drink plenty of water.  Gradually increase your fiber intake such as prunes, oatmeal, fruits and vegetables. Bran cereals are   Continue Miralax for constipation as needed.  If you see blood in your stool or your symptoms worsen please let us know right away.

## 2013-01-31 NOTE — Assessment & Plan Note (Signed)
?   Diverticulitis flare. Start Cipro 500 mg twice a day x7 days. Instructed patient to gradually increase fiber as well as hydration. May continue to use MiraLax as needed for constipation. RTC if symptoms worsen or are not improved within one week.

## 2013-01-31 NOTE — Progress Notes (Signed)
  Subjective:    Patient ID: Nancy Doyle, female    DOB: April 22, 1935, 78 y.o.   MRN: [id number]  HPI  Patient is a pleasant 78 year old female with history of constipation, diverticulosis and GI bleed who presents to clinic with one month history of changes in bowel habits and discomfort in left lower quadrant. She denies changing her eating habits, taking any new medications whether over-the-counter or prescribed, denies fever or chills, hematochezia, melena. Patient denies any nausea or vomiting. Patient reports having daily bowel movements. The bowel movements are formed but are smaller   Current Outpatient Prescriptions on File Prior to Visit  Medication Sig Dispense Refill  . ALPRAZolam (XANAX) 0.5 MG tablet Take 1 tablet (0.5 mg total) by mouth 2 (two) times daily as needed.  60 tablet  3  . amLODipine (NORVASC) 10 MG tablet Take 1 tablet (10 mg total) by mouth daily.  30 tablet  5  . Calcium Carbonate-Vitamin D (CALCIUM + D) 600-200 MG-UNIT TABS Take 1 tablet by mouth daily.        Marland Kitchen HYDROcodone-acetaminophen (VICODIN ES) 7.5-750 MG per tablet Take one tablet by mouth at bedtime as needed for severe pain.  30 tablet  3  . multivitamin-iron-minerals-folic acid (CENTRUM) chewable tablet Chew 1 tablet by mouth daily.        Marland Kitchen omeprazole (PRILOSEC) 10 MG capsule Take 10 mg by mouth daily.        . polyethylene glycol (MIRALAX / GLYCOLAX) packet Take 17 g by mouth daily as needed.        . travoprost, benzalkonium, (TRAVATAN) 0.004 % ophthalmic solution Place 1 drop into both eyes at bedtime.         No current facility-administered medications on file prior to visit.   . Review of Systems  Gastrointestinal: Positive for abdominal pain. Negative for nausea, vomiting, diarrhea, constipation, blood in stool, abdominal distention and anal bleeding.       Change in bowel pattern     BP 124/68  Pulse 84  Temp(Src) 97.7 F (36.5 C) (Oral)  Resp 12  Wt 157 lb (71.215 kg)  BMI 25.35  kg/m2  SpO2 97%    Objective:   Physical Exam  Constitutional: She is oriented to person, place, and time. She appears well-developed and well-nourished. No distress.  Cardiovascular: Normal rate and regular rhythm.  Exam reveals no gallop and no friction rub.   No murmur heard. Pulmonary/Chest: Effort normal and breath sounds normal. No respiratory distress. She has no wheezes.  Abdominal: Soft.  Hyperactive bowel sounds. Tenderness with palpation over left lower quadrant  Neurological: She is alert and oriented to person, place, and time.  Psychiatric: She has a normal mood and affect. Her behavior is normal. Judgment and thought content normal.          Assessment & Plan:

## 2013-04-24 ENCOUNTER — Other Ambulatory Visit: Payer: Self-pay | Admitting: *Deleted

## 2013-04-24 DIAGNOSIS — F411 Generalized anxiety disorder: Secondary | ICD-10-CM

## 2013-04-24 NOTE — Telephone Encounter (Signed)
Refill Request  Tramadol HCL 50 mg tablet  Take one tablet by mouth every 8 hours

## 2013-04-25 ENCOUNTER — Other Ambulatory Visit: Payer: Self-pay | Admitting: *Deleted

## 2013-04-25 DIAGNOSIS — F411 Generalized anxiety disorder: Secondary | ICD-10-CM

## 2013-04-25 NOTE — Telephone Encounter (Signed)
Okay to refill? Last seen by you in October 2013 (Acute visit)-Saw Raquel twice for acute problems

## 2013-04-26 MED ORDER — TRAMADOL HCL 50 MG PO TABS: 50.0000 mg | ORAL_TABLET | Freq: Three times a day (TID) | ORAL | Status: DC | PRN

## 2013-04-26 NOTE — Telephone Encounter (Signed)
#  30 tramadol approved.  Will not be refilled again with  An office visit.

## 2013-04-26 NOTE — Telephone Encounter (Signed)
Pt notified & sent to front office to schedule an appt

## 2013-05-03 ENCOUNTER — Encounter: Payer: Self-pay | Admitting: *Deleted

## 2013-05-06 ENCOUNTER — Encounter: Payer: Self-pay | Admitting: Internal Medicine

## 2013-05-06 ENCOUNTER — Ambulatory Visit (INDEPENDENT_AMBULATORY_CARE_PROVIDER_SITE_OTHER): Payer: Medicare Other | Admitting: Internal Medicine

## 2013-05-06 VITALS — BP 112/62 | HR 73 | Temp 97.6°F | Resp 14 | Ht 65.0 in | Wt 156.2 lb

## 2013-05-06 DIAGNOSIS — M549 Dorsalgia, unspecified: Secondary | ICD-10-CM

## 2013-05-06 DIAGNOSIS — G8929 Other chronic pain: Secondary | ICD-10-CM

## 2013-05-06 DIAGNOSIS — I10 Essential (primary) hypertension: Secondary | ICD-10-CM

## 2013-05-06 DIAGNOSIS — K922 Gastrointestinal hemorrhage, unspecified: Secondary | ICD-10-CM

## 2013-05-06 DIAGNOSIS — F17200 Nicotine dependence, unspecified, uncomplicated: Secondary | ICD-10-CM

## 2013-05-06 DIAGNOSIS — E785 Hyperlipidemia, unspecified: Secondary | ICD-10-CM

## 2013-05-06 DIAGNOSIS — R5381 Other malaise: Secondary | ICD-10-CM

## 2013-05-06 DIAGNOSIS — E559 Vitamin D deficiency, unspecified: Secondary | ICD-10-CM

## 2013-05-06 DIAGNOSIS — Z716 Tobacco abuse counseling: Secondary | ICD-10-CM

## 2013-05-06 DIAGNOSIS — Z7189 Other specified counseling: Secondary | ICD-10-CM

## 2013-05-06 DIAGNOSIS — F411 Generalized anxiety disorder: Secondary | ICD-10-CM

## 2013-05-06 LAB — CBC WITH DIFFERENTIAL/PLATELET
Basophils Absolute: 0 10*3/uL (ref 0.0–0.1)
Eosinophils Absolute: 0 10*3/uL (ref 0.0–0.7)
Lymphocytes Relative: 30.7 % (ref 12.0–46.0)
MCHC: 33 g/dL (ref 30.0–36.0)
MCV: 93.1 fl (ref 78.0–100.0)
Monocytes Absolute: 1.3 10*3/uL — ABNORMAL HIGH (ref 0.1–1.0)
Neutrophils Relative %: 50.5 % (ref 43.0–77.0)
Platelets: 263 10*3/uL (ref 150.0–400.0)
RDW: 15.4 % — ABNORMAL HIGH (ref 11.5–14.6)

## 2013-05-06 LAB — COMPREHENSIVE METABOLIC PANEL
ALT: 11 U/L (ref 0–35)
AST: 15 U/L (ref 0–37)
Albumin: 4.2 g/dL (ref 3.5–5.2)
CO2: 29 mEq/L (ref 19–32)
Calcium: 9.3 mg/dL (ref 8.4–10.5)
Chloride: 103 mEq/L (ref 96–112)
Creatinine, Ser: 0.6 mg/dL (ref 0.4–1.2)
GFR: 95.91 mL/min (ref >60.00)
Potassium: 4.2 mEq/L (ref 3.5–5.1)
Sodium: 139 mEq/L (ref 135–145)
Total Protein: 7.6 g/dL (ref 6.0–8.3)

## 2013-05-06 LAB — LIPID PANEL
Cholesterol: 256 mg/dL — ABNORMAL HIGH (ref 0–200)
Total CHOL/HDL Ratio: 8

## 2013-05-06 LAB — TSH: TSH: 1.69 u[IU]/mL (ref 0.35–5.50)

## 2013-05-06 MED ORDER — TRAMADOL HCL 50 MG PO TABS: 50.0000 mg | ORAL_TABLET | Freq: Three times a day (TID) | ORAL | Status: DC | PRN

## 2013-05-06 MED ORDER — TETANUS-DIPHTH-ACELL PERTUSSIS 5-2.5-18.5 LF-MCG/0.5 IM SUSP: 0.5000 mL | Freq: Once | INTRAMUSCULAR | Status: DC

## 2013-05-06 NOTE — Assessment & Plan Note (Signed)
Repeat DEXA due this year; treatment options limited to Prolia or Reclast given ongoing tobacco abuse and history of GI bleed

## 2013-05-06 NOTE — Assessment & Plan Note (Signed)
Smoking cessation instruction/counseling given:  counseled patient on the dangers of tobacco use, advised patient to stop smoking, and reviewed strategies to maximize success 

## 2013-05-06 NOTE — Assessment & Plan Note (Signed)
Managed with alprazloam
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
## 2013-05-06 NOTE — Assessment & Plan Note (Signed)
Well controlled on current regimen. Renal function stable, no changes today. 

## 2013-05-06 NOTE — Assessment & Plan Note (Signed)
No NSAIDs 
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
## 2013-05-06 NOTE — Patient Instructions (Addendum)
I have increased your tramadol use to every 4 hours as needed (maximum 6 daily)  Please return in 6 months for your annual physical  We will schedule your mammogram soon.   You need to have a TDaP vaccine and a Prevnar (pneumonia) vaccine.  I have given you prescriptions for these because they will be cheaper at the health Dept or at your local pharmacy because Medicare will not reimburse for them.   You refused the influenza vaccine today.  We will contact you with the bloodwork results

## 2013-05-07 ENCOUNTER — Other Ambulatory Visit: Payer: Self-pay | Admitting: *Deleted

## 2013-05-07 MED ORDER — TRAMADOL HCL 50 MG PO TABS: 50.0000 mg | ORAL_TABLET | ORAL | Status: AC | PRN

## 2013-05-07 NOTE — Telephone Encounter (Signed)
Rx printed on 9/22 & directions on AVS did not match for the Tramadol (pharmacy would not fill it). New Rx given to Dr. Darrick Huntsman to sign

## 2013-05-13 ENCOUNTER — Other Ambulatory Visit: Payer: Self-pay | Admitting: Internal Medicine

## 2013-05-13 ENCOUNTER — Other Ambulatory Visit: Payer: Self-pay | Admitting: *Deleted

## 2013-05-13 DIAGNOSIS — F411 Generalized anxiety disorder: Secondary | ICD-10-CM

## 2013-05-13 NOTE — Telephone Encounter (Signed)
Pt is calling and is almost out of her meds. She is needing refill on Alprazolam and Amlodapine. I see them in the system pending ????

## 2013-05-15 ENCOUNTER — Encounter: Payer: Self-pay | Admitting: *Deleted

## 2013-05-15 MED ORDER — ALPRAZOLAM 0.5 MG PO TABS: 0.5000 mg | ORAL_TABLET | Freq: Two times a day (BID) | ORAL | Status: DC | PRN

## 2013-05-17 ENCOUNTER — Telehealth: Payer: Self-pay | Admitting: Internal Medicine

## 2013-05-17 NOTE — Telephone Encounter (Signed)
Script marked as phone in recalled to pharmacy.

## 2013-05-17 NOTE — Telephone Encounter (Signed)
Pt is almost completely out of Alprazolam and I see where E[facility] but pharmacy is saying they never recieved it ??

## 2013-05-21 ENCOUNTER — Encounter: Payer: Self-pay | Admitting: Internal Medicine

## 2013-05-21 ENCOUNTER — Other Ambulatory Visit: Payer: Self-pay | Admitting: Internal Medicine

## 2013-07-30 ENCOUNTER — Telehealth: Payer: Self-pay | Admitting: Internal Medicine

## 2013-07-30 NOTE — Telephone Encounter (Signed)
RN calling patient regarding hypertension and lightheadedness.  On callback, RN unable to reach patient at number given; message left on unidentified voicemail to contact office for assistance.  krs/[email or web address]

## 2013-07-31 NOTE — Telephone Encounter (Signed)
Pt advised and states an understanding 

## 2013-07-31 NOTE — Telephone Encounter (Signed)
Pt at eye doctor, states will call office back

## 2013-07-31 NOTE — Telephone Encounter (Signed)
Continue to check bp daily make appt if symptoms recur or if bp is persistently > 140/80

## 2013-07-31 NOTE — Telephone Encounter (Signed)
Pt states last Friday-Monday, had intermittent episodes of lightheadedness with changing positions. Checked BP on Friday 160/75. Has checked it since then: 159/81, 179/87. No feeling of lightheadedness the last 2 days. BP today was 114/55 with no symptoms. Taking Amlodipine 10 mg daily. Denies any new medications or changes to her medication list. Denied any other symptoms with the lightheadedness, denied pain, headache, increased stress or anxiety, fever or N/V, palpitations or irregular heart rate.

## 2013-09-05 ENCOUNTER — Ambulatory Visit (INDEPENDENT_AMBULATORY_CARE_PROVIDER_SITE_OTHER): Payer: Medicare Other | Admitting: Internal Medicine

## 2013-09-05 ENCOUNTER — Ambulatory Visit: Payer: Medicare Other

## 2013-09-05 ENCOUNTER — Encounter (INDEPENDENT_AMBULATORY_CARE_PROVIDER_SITE_OTHER): Payer: Self-pay

## 2013-09-05 ENCOUNTER — Encounter: Payer: Self-pay | Admitting: Internal Medicine

## 2013-09-05 VITALS — BP 128/68 | HR 72 | Temp 97.9°F | Resp 14 | Ht 65.0 in | Wt 153.8 lb

## 2013-09-05 DIAGNOSIS — Z0000 Encounter for general adult medical examination without abnormal findings: Secondary | ICD-10-CM

## 2013-09-05 DIAGNOSIS — E559 Vitamin D deficiency, unspecified: Secondary | ICD-10-CM

## 2013-09-05 DIAGNOSIS — Z716 Tobacco abuse counseling: Secondary | ICD-10-CM

## 2013-09-05 DIAGNOSIS — F411 Generalized anxiety disorder: Secondary | ICD-10-CM

## 2013-09-05 DIAGNOSIS — Z1239 Encounter for other screening for malignant neoplasm of breast: Secondary | ICD-10-CM

## 2013-09-05 DIAGNOSIS — H409 Unspecified glaucoma: Secondary | ICD-10-CM | POA: Insufficient documentation

## 2013-09-05 DIAGNOSIS — I10 Essential (primary) hypertension: Secondary | ICD-10-CM

## 2013-09-05 DIAGNOSIS — Z720 Tobacco use: Secondary | ICD-10-CM

## 2013-09-05 DIAGNOSIS — R5383 Other fatigue: Secondary | ICD-10-CM

## 2013-09-05 DIAGNOSIS — E785 Hyperlipidemia, unspecified: Secondary | ICD-10-CM

## 2013-09-05 DIAGNOSIS — D729 Disorder of white blood cells, unspecified: Secondary | ICD-10-CM

## 2013-09-05 DIAGNOSIS — Z9849 Cataract extraction status, unspecified eye: Secondary | ICD-10-CM

## 2013-09-05 DIAGNOSIS — R5381 Other malaise: Secondary | ICD-10-CM

## 2013-09-05 DIAGNOSIS — F17200 Nicotine dependence, unspecified, uncomplicated: Secondary | ICD-10-CM

## 2013-09-05 DIAGNOSIS — Z7189 Other specified counseling: Secondary | ICD-10-CM

## 2013-09-05 LAB — LIPID PANEL
CHOL/HDL RATIO: 6
CHOLESTEROL: 234 mg/dL — AB (ref 0–200)
HDL: 39.8 mg/dL (ref >39.00)
TRIGLYCERIDES: 145 mg/dL (ref 0.0–149.0)
VLDL: 29 mg/dL (ref 0.0–40.0)

## 2013-09-05 LAB — CBC WITH DIFFERENTIAL/PLATELET
BASOS ABS: 0 10*3/uL (ref 0.0–0.1)
Basophils Relative: 0.3 % (ref 0.0–3.0)
Eosinophils Absolute: 0.1 10*3/uL (ref 0.0–0.7)
Eosinophils Relative: 0.8 % (ref 0.0–5.0)
HCT: 42.5 % (ref 36.0–46.0)
Hemoglobin: 13.9 g/dL (ref 12.0–15.0)
LYMPHS PCT: 26.1 % (ref 12.0–46.0)
Lymphs Abs: 2 10*3/uL (ref 0.7–4.0)
MCHC: 32.7 g/dL (ref 30.0–36.0)
MCV: 91.1 fl (ref 78.0–100.0)
MONOS PCT: 28.5 % — AB (ref 3.0–12.0)
Monocytes Absolute: 2.2 10*3/uL — ABNORMAL HIGH (ref 0.1–1.0)
NEUTROS PCT: 44.3 % (ref 43.0–77.0)
Neutro Abs: 3.4 10*3/uL (ref 1.4–7.7)
PLATELETS: 265 10*3/uL (ref 150.0–400.0)
RBC: 4.66 Mil/uL (ref 3.87–5.11)
RDW: 15.7 % — AB (ref 11.5–14.6)
WBC: 7.6 10*3/uL (ref 4.5–10.5)

## 2013-09-05 LAB — COMPREHENSIVE METABOLIC PANEL
ALK PHOS: 85 U/L (ref 39–117)
ALT: 13 U/L (ref 0–35)
AST: 14 U/L (ref 0–37)
Albumin: 3.9 g/dL (ref 3.5–5.2)
BUN: 7 mg/dL (ref 6–23)
CO2: 29 mEq/L (ref 19–32)
CREATININE: 0.5 mg/dL (ref 0.4–1.2)
Calcium: 9 mg/dL (ref 8.4–10.5)
Chloride: 102 mEq/L (ref 96–112)
GFR: 133.56 mL/min (ref >60.00)
Glucose, Bld: 89 mg/dL (ref 70–99)
Potassium: 4.7 mEq/L (ref 3.5–5.1)
SODIUM: 140 meq/L (ref 135–145)
TOTAL PROTEIN: 7.6 g/dL (ref 6.0–8.3)
Total Bilirubin: 0.5 mg/dL (ref 0.3–1.2)

## 2013-09-05 MED ORDER — HYDROCHLOROTHIAZIDE 12.5 MG PO TABS: 12.5000 mg | ORAL_TABLET | Freq: Every day | ORAL | Status: DC

## 2013-09-05 MED ORDER — ZOSTER VACCINE LIVE 19400 UNT/0.65ML ~~LOC~~ SOLR: 0.6500 mL | Freq: Once | SUBCUTANEOUS | Status: DC

## 2013-09-05 MED ORDER — TETANUS-DIPHTH-ACELL PERTUSSIS 5-2.5-18.5 LF-MCG/0.5 IM SUSP: 0.5000 mL | Freq: Once | INTRAMUSCULAR | Status: DC

## 2013-09-05 NOTE — Assessment & Plan Note (Addendum)
Not well controlled currently.  Add hctz to amlodipine for edema.

## 2013-09-05 NOTE — Progress Notes (Signed)
Pre-visit discussion using our clinic review tool. No additional management support is needed unless otherwise documented below in the visit note.  

## 2013-09-05 NOTE — Patient Instructions (Addendum)
You had your annual Medicare wellness exam today  Amber will schedule your mammogram today   Please use the stool kit to send [place] back a sample to test for blood.  This is your colon CA screening test.   I am recommending that you have the new pneumonia vaccine and a noncontrasted chest CT  To screen for lung cancer.  This is recommended but you have declined .  If you change yoyr mind,  I will be happy to schedule it for you  You need to have a TDaP vaccine and a Shingles vaccine.  I have given you prescriptions for thses because they will be cheaper at the health Dept or at your local pharmacy because Medicare will not reimburse for them.   We will contact you with the bloodwork results  Your fluid retention in the legs may be due to the amlodipine, or due to gravity (venous insufficiency),  Or right sided heart failure from COPD  I have added a mild diuretic that you can take daily in the morning that will also help your blood pressure  If it does not help, or if it causes leg cramps or low blood pressure,  Stop  It and let me know
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
## 2013-09-06 ENCOUNTER — Encounter: Payer: Self-pay | Admitting: Emergency Medicine

## 2013-09-06 LAB — MICROALBUMIN / CREATININE URINE RATIO
Creatinine,U: 69.1 mg/dL
Microalb Creat Ratio: 3.9 mg/g (ref 0.0–30.0)
Microalb, Ur: 2.7 mg/dL — ABNORMAL HIGH (ref 0.0–1.9)

## 2013-09-06 LAB — PATHOLOGIST SMEAR REVIEW

## 2013-09-06 LAB — VITAMIN D 25 HYDROXY (VIT D DEFICIENCY, FRACTURES): Vit D, 25-Hydroxy: 48 ng/mL (ref 30–89)

## 2013-09-06 LAB — LDL CHOLESTEROL, DIRECT: LDL DIRECT: 176.2 mg/dL

## 2013-09-07 ENCOUNTER — Encounter: Payer: Self-pay | Admitting: Internal Medicine

## 2013-09-07 DIAGNOSIS — Z0000 Encounter for general adult medical examination without abnormal findings: Secondary | ICD-10-CM | POA: Insufficient documentation

## 2013-09-07 NOTE — Assessment & Plan Note (Signed)
Risks of continued tobacco use were discussed. She is not currently interested in tobacco cessation.

## 2013-09-07 NOTE — Assessment & Plan Note (Signed)
Smoking cessation instruction/counseling given:  counseled patient on the dangers of tobacco use, advised patient to stop smoking, and reviewed strategies to maximize success 

## 2013-09-07 NOTE — Assessment & Plan Note (Signed)
Annual comprehensive exam was done including breast, excluding pelvic and PAP smear. All screenings have been addressed .  

## 2013-09-07 NOTE — Assessment & Plan Note (Signed)
Managed with alprazloam. She declines trial of SSRI

## 2013-09-18 ENCOUNTER — Ambulatory Visit: Payer: Self-pay | Admitting: Internal Medicine

## 2013-10-14 ENCOUNTER — Encounter: Payer: Self-pay | Admitting: Internal Medicine

## 2013-11-05 ENCOUNTER — Other Ambulatory Visit: Payer: Self-pay | Admitting: *Deleted

## 2013-11-05 DIAGNOSIS — F411 Generalized anxiety disorder: Secondary | ICD-10-CM

## 2013-11-05 MED ORDER — ALPRAZOLAM 0.5 MG PO TABS: 0.5000 mg | ORAL_TABLET | Freq: Two times a day (BID) | ORAL | Status: DC | PRN

## 2013-11-05 NOTE — Telephone Encounter (Signed)
Ok to refill,  printed rx  

## 2013-11-05 NOTE — Telephone Encounter (Signed)
Please advise Ok to  [place]?

## 2013-11-06 ENCOUNTER — Other Ambulatory Visit: Payer: Self-pay | Admitting: *Deleted

## 2013-11-06 DIAGNOSIS — F411 Generalized anxiety disorder: Secondary | ICD-10-CM

## 2013-11-06 NOTE — Telephone Encounter (Signed)
Okay to refill? 

## 2013-11-07 MED ORDER — ALPRAZOLAM 0.5 MG PO TABS: 0.5000 mg | ORAL_TABLET | Freq: Two times a day (BID) | ORAL | Status: DC | PRN

## 2013-11-07 NOTE — Telephone Encounter (Signed)
Ok to refill,  printed rx  

## 2013-11-07 NOTE — Telephone Encounter (Signed)
Script faxed.

## 2013-12-09 ENCOUNTER — Other Ambulatory Visit: Payer: Self-pay | Admitting: *Deleted

## 2013-12-09 MED ORDER — AMLODIPINE BESYLATE 10 MG PO TABS: ORAL_TABLET | ORAL | Status: DC

## 2013-12-10 ENCOUNTER — Other Ambulatory Visit: Payer: Self-pay | Admitting: *Deleted

## 2013-12-23 ENCOUNTER — Other Ambulatory Visit: Payer: Self-pay | Admitting: *Deleted

## 2013-12-23 DIAGNOSIS — I10 Essential (primary) hypertension: Secondary | ICD-10-CM

## 2013-12-23 MED ORDER — HYDROCHLOROTHIAZIDE 12.5 MG PO TABS: 12.5000 mg | ORAL_TABLET | Freq: Every day | ORAL | Status: DC

## 2014-05-03 ENCOUNTER — Other Ambulatory Visit: Payer: Self-pay | Admitting: Internal Medicine

## 2014-05-12 ENCOUNTER — Ambulatory Visit (INDEPENDENT_AMBULATORY_CARE_PROVIDER_SITE_OTHER): Payer: Medicare Other | Admitting: Internal Medicine

## 2014-05-12 ENCOUNTER — Encounter: Payer: Self-pay | Admitting: Internal Medicine

## 2014-05-12 VITALS — BP 110/64 | HR 71 | Temp 97.5°F | Wt 145.0 lb

## 2014-05-12 DIAGNOSIS — K5732 Diverticulitis of large intestine without perforation or abscess without bleeding: Secondary | ICD-10-CM

## 2014-05-12 DIAGNOSIS — R1032 Left lower quadrant pain: Secondary | ICD-10-CM

## 2014-05-12 MED ORDER — METRONIDAZOLE 500 MG PO TABS: 500.0000 mg | ORAL_TABLET | Freq: Three times a day (TID) | ORAL | Status: DC

## 2014-05-12 MED ORDER — CIPROFLOXACIN HCL 500 MG PO TABS: 500.0000 mg | ORAL_TABLET | Freq: Two times a day (BID) | ORAL | Status: DC

## 2014-05-12 NOTE — Progress Notes (Signed)
Subjective:    Patient ID: Nancy Doyle, female    DOB: [birth year], 79 y.o.   MRN: [id number]  HPI  Pt presents to the clinic today with c.o LLQ pain. She reports this started 4 days ago. She describes the pain as crampy. She reports the pain radiates to the center of her abdomen. The pain is worse with coughing and taking deep breaths. She has had diarrhea 1 week ago but is now having normal stools but loose. She has had some associated nausea and decrease appetite. She denies fever ,chills, vomiting or urinary symptoms. She has tried aspirin OTC.  Review of Systems      Past Medical History  Diagnosis Date  . GI bleed Nov 2009    3  ulcers found, required transfusion, repeat endoscopy showed mild gastritis  . Hypercholesterolemia     refuses statin therapy  . Diverticulitis 2005    hospitalized  . Hypertension   . Osteoporosis   . Tobacco abuse   . Tobacco abuse counseling     Current Outpatient Prescriptions  Medication Sig Dispense Refill  . ALPRAZolam (XANAX) 0.5 MG tablet Take 1 tablet (0.5 mg total) by mouth 2 (two) times daily as needed.  60 tablet  3  . amLODipine (NORVASC) 10 MG tablet TAKE 1 TABLET (10 MG TOTAL) BY MOUTH DAILY.  30 tablet  5  . Calcium Carbonate-Vitamin D (CALCIUM + D) 600-200 MG-UNIT TABS Take 1 tablet by mouth daily.        . hydrochlorothiazide (HYDRODIURIL) 12.5 MG tablet Take 1 tablet (12.5 mg total) by mouth daily.  30 tablet  5  . multivitamin-iron-minerals-folic acid (CENTRUM) chewable tablet Chew 1 tablet by mouth daily.        Marland Kitchen omeprazole (PRILOSEC) 10 MG capsule Take 10 mg by mouth daily.        . TDaP (BOOSTRIX) 5-2.5-18.5 LF-MCG/0.5 injection Inject 0.5 mLs into the muscle once.  0.5 mL  0  . Tdap (BOOSTRIX) 5-2.5-18.5 LF-MCG/0.5 injection Inject 0.5 mLs into the muscle once.  0.5 mL  0  . travoprost, benzalkonium, (TRAVATAN) 0.004 % ophthalmic solution Place 1 drop into both eyes at bedtime.        . zoster vaccine live, PF,  (ZOSTAVAX) 32992 UNT/0.65ML injection Inject 19,400 Units into the skin once.  1 each  0  . ciprofloxacin (CIPRO) 500 MG tablet Take 1 tablet (500 mg total) by mouth 2 (two) times daily.  20 tablet  0  . metroNIDAZOLE (FLAGYL) 500 MG tablet Take 1 tablet (500 mg total) by mouth 3 (three) times daily.  30 tablet  0   No current facility-administered medications for this visit.    Allergies  Allergen Reactions  . Penicillins Rash    History reviewed. No pertinent family history.  History   Social History  . Marital Status: Single    Spouse Name: N/A    Number of Children: N/A  . Years of Education: N/A   Occupational History  . Retired    Social History Main Topics  . Smoking status: Current Every Day Smoker -- 0.50 packs/day for 15 years    Types: Cigarettes  . Smokeless tobacco: Never Used  . Alcohol Use: No  . Drug Use: No  . Sexual Activity: Not Currently   Other Topics Concern  . Not on file   Social History Narrative  . No narrative on file     Constitutional: Denies fever, malaise, fatigue, headache or abrupt weight changes.  Respiratory: Denies difficulty breathing, shortness of breath, cough or sputum production.   Cardiovascular: Denies chest pain, chest tightness, palpitations or swelling in the hands or feet.  Gastrointestinal: Pt reports abdominal pain. Denies bloating, constipation, or blood in the stool.  GU: Denies urgency, frequency, pain with urination, burning sensation, blood in urine, odor or discharge.   No other specific complaints in a complete review of systems (except as listed in HPI above).  Objective:   Physical Exam  BP 110/64  Pulse 71  Temp(Src) 97.5 F (36.4 C) (Oral)  Wt 145 lb (65.772 kg)  SpO2 97% Wt Readings from Last 3 Encounters:  05/12/14 145 lb (65.772 kg)  09/05/13 153 lb 12 oz (69.741 kg)  05/06/13 156 lb 4 oz (70.875 kg)    General: Appears her stated age, well developed, well nourished in NAD. Cardiovascular:  Normal rate and rhythm. S1,S2 noted.  No murmur, rubs or gallops noted.  Pulmonary/Chest: Normal effort and positive vesicular breath sounds. No respiratory distress. No wheezes, rales or ronchi noted.  Abdomen: Soft and tender in the LLQ. Normal bowel sounds, no bruits noted. No distention or masses noted. Liver, spleen and kidneys non palpable.   BMET    Component Value Date/Time   NA 140 09/05/2013 1131   K 4.7 09/05/2013 1131   CL 102 09/05/2013 1131   CO2 29 09/05/2013 1131   GLUCOSE 89 09/05/2013 1131   BUN 7 09/05/2013 1131   CREATININE 0.5 09/05/2013 1131   CREATININE 0.66 11/29/2011 1043   CALCIUM 9.0 09/05/2013 1131   GFRNONAA 87 11/29/2011 1043   GFRAA >89 11/29/2011 1043    Lipid Panel     Component Value Date/Time   CHOL 234* 09/05/2013 1131   TRIG 145.0 09/05/2013 1131   HDL 39.80 09/05/2013 1131   CHOLHDL 6 09/05/2013 1131   VLDL 29.0 09/05/2013 1131    CBC    Component Value Date/Time   WBC 7.6 09/05/2013 1131   RBC 4.66 09/05/2013 1131   HGB 13.9 09/05/2013 1131   HCT 42.5 09/05/2013 1131   PLT 265.0 09/05/2013 1131   MCV 91.1 09/05/2013 1131   MCHC 32.7 09/05/2013 1131   RDW 15.7* 09/05/2013 1131   LYMPHSABS 2.0 09/05/2013 1131   MONOABS 2.2* 09/05/2013 1131   EOSABS 0.1 09/05/2013 1131   BASOSABS 0.0 09/05/2013 1131    Hgb A1C No results found for this basename: HGBA1C         Assessment & Plan:   LLQ pain concerning for diverticulitis:  Will treat with cipro and flagyl Diet information given Push fluids   RTC as needed or if symptoms persist or worsen

## 2014-05-12 NOTE — Patient Instructions (Signed)

## 2014-05-12 NOTE — Progress Notes (Signed)
Pre visit review using our clinic review tool, if applicable. No additional management support is needed unless otherwise documented below in the visit note. 

## 2014-05-26 ENCOUNTER — Telehealth: Payer: Self-pay | Admitting: Internal Medicine

## 2014-05-26 ENCOUNTER — Ambulatory Visit: Payer: Self-pay | Admitting: Family Medicine

## 2014-05-26 NOTE — Telephone Encounter (Signed)
Patient Information:  Caller Name: Libni  Phone: [phone]  Patient: Nancy, Doyle  Gender: Female  DOB: July 11, 1935  Age: 79 Years  PCP: Deborra Medina (Adults only)  Office Follow Up:  Does the office need to follow up with this patient?: No  Instructions For The Office: N/A   Symptoms  Reason For Call & Symptoms: Seen on 05/12/14 for L sided abdominal pain and tenderness and prescribed 2 antibiotics and took them for 4 days and stopped taking them d/t nausea and  dry heaves. She has continued with Nausea an dry heaves, poor appetite and feeling tired. She has ongoing issue with constipation and only passes small pieces of stool  every few days. She had small hard stool yesterday. She is not eating much but is taking fluids. She is taking OTC Motion Sickness medication to help with nausea.   Reviewed Health History In EMR: Yes  Reviewed Medications In EMR: Yes  Reviewed Allergies In EMR: Yes  Reviewed Surgeries / Procedures: Yes  Date of Onset of Symptoms: 05/14/2014  Treatments Tried: [employer] brand motion sickness medicine  Treatments Tried Worked: Yes  Guideline(s) Used:  Vomiting  Disposition Per Guideline:   See Today in Office  Reason For Disposition Reached:   Vomiting lasts > 48 hours  Advice Given:  Expected Course:  Vomiting from viral gastritis usually stops in 12 to 48 hours.  If diarrhea is present, it usually lasts for several days.  People with mild dehydration can usually treat themselves at home, by drinking more liquids.  People with moderate to severe dehydration may need medical care. signs of this include very dry mouth, dizziness, weakness, and decreased urination.  Call Back If:  Vomiting lasts for more than 2 days (48 hours)  Signs of dehydration occur  You become worse.  Patient Will Follow Care Advice:  YES  Appointment Scheduled:  05/26/2014 12:15:00 Appointment Scheduled Provider:  Other

## 2014-05-27 ENCOUNTER — Encounter: Payer: Self-pay | Admitting: Family Medicine

## 2014-05-27 ENCOUNTER — Ambulatory Visit: Payer: Self-pay | Admitting: Endocrinology

## 2014-05-27 ENCOUNTER — Ambulatory Visit (INDEPENDENT_AMBULATORY_CARE_PROVIDER_SITE_OTHER): Payer: Medicare Other | Admitting: Family Medicine

## 2014-05-27 VITALS — BP 124/64 | HR 88 | Temp 97.6°F | Wt 142.2 lb

## 2014-05-27 DIAGNOSIS — R110 Nausea: Secondary | ICD-10-CM

## 2014-05-27 DIAGNOSIS — R1032 Left lower quadrant pain: Secondary | ICD-10-CM | POA: Insufficient documentation

## 2014-05-27 LAB — COMPREHENSIVE METABOLIC PANEL
ALBUMIN: 3.6 g/dL (ref 3.5–5.2)
ALT: 54 U/L — ABNORMAL HIGH (ref 0–35)
AST: 104 U/L — AB (ref 0–37)
Alkaline Phosphatase: 116 U/L (ref 39–117)
BUN: 10 mg/dL (ref 6–23)
CO2: 30 mEq/L (ref 19–32)
CREATININE: 0.8 mg/dL (ref 0.4–1.2)
Calcium: 9.6 mg/dL (ref 8.4–10.5)
Chloride: 98 mEq/L (ref 96–112)
GFR: 72.88 mL/min (ref >60.00)
GLUCOSE: 127 mg/dL — AB (ref 70–99)
POTASSIUM: 3.8 meq/L (ref 3.5–5.1)
Sodium: 138 mEq/L (ref 135–145)
Total Bilirubin: 0.8 mg/dL (ref 0.2–1.2)
Total Protein: 8.3 g/dL (ref 6.0–8.3)

## 2014-05-27 LAB — CBC WITH DIFFERENTIAL/PLATELET
BASOS PCT: 0.6 % (ref 0.0–3.0)
Basophils Absolute: 0 10*3/uL (ref 0.0–0.1)
EOS PCT: 0.4 % (ref 0.0–5.0)
Eosinophils Absolute: 0 10*3/uL (ref 0.0–0.7)
HCT: 43.4 % (ref 36.0–46.0)
HEMOGLOBIN: 13.7 g/dL (ref 12.0–15.0)
Lymphocytes Relative: 22.6 % (ref 12.0–46.0)
Lymphs Abs: 1.8 10*3/uL (ref 0.7–4.0)
MCHC: 31.6 g/dL (ref 30.0–36.0)
MCV: 91.3 fl (ref 78.0–100.0)
Monocytes Absolute: 2 10*3/uL — ABNORMAL HIGH (ref 0.1–1.0)
Monocytes Relative: 24.3 % — ABNORMAL HIGH (ref 3.0–12.0)
NEUTROS ABS: 4.2 10*3/uL (ref 1.4–7.7)
NEUTROS PCT: 52.1 % (ref 43.0–77.0)
Platelets: 306 10*3/uL (ref 150.0–400.0)
RBC: 4.76 Mil/uL (ref 3.87–5.11)
RDW: 15.9 % — ABNORMAL HIGH (ref 11.5–15.5)
WBC: 8.1 10*3/uL (ref 4.0–10.5)

## 2014-05-27 LAB — LIPASE: Lipase: 59 U/L (ref 11.0–59.0)

## 2014-05-27 MED ORDER — CIPROFLOXACIN HCL 500 MG PO TABS
500.0000 mg | ORAL_TABLET | Freq: Two times a day (BID) | ORAL | Status: DC
Start: 2014-05-27 — End: 2014-12-18

## 2014-05-27 MED ORDER — ONDANSETRON 4 MG PO TBDP
4.0000 mg | ORAL_TABLET | Freq: Once | ORAL | Status: AC
  Administered 2014-05-27: 4 mg via ORAL

## 2014-05-27 MED ORDER — ONDANSETRON HCL 4 MG PO TABS: 4.0000 mg | ORAL_TABLET | Freq: Three times a day (TID) | ORAL | Status: DC | PRN

## 2014-05-27 NOTE — Patient Instructions (Signed)
I think you have persistent untreated diverticulitis. Restart cipro 500mg  twice daily for next 10 days (I've sent in another 4 days to complete 10 day course), update [place] with effect or if nausea recurs. Use zofran for nausea as needed at home. Push small sips of fluids throughout the day. Clear liquid diet for next 2 days - chicken broth, jello, ginger ale. Then advance to bland diet as tolerated. If not improving, or if persistent discomfort or weight loss noted, let us know.

## 2014-05-27 NOTE — Addendum Note (Signed)
Addended by: Royann Shivers A on: 05/27/2014 09:50 AM   Modules accepted: Orders

## 2014-05-27 NOTE — Assessment & Plan Note (Addendum)
Suspicion for incompletely treated diverticulitis. Only able to tolerate 4d cipro+flagyl, anticipate flagyl or combination led to nausea/dry heaves. Pt states she has tolerated cipro alone well in past so advised to restart cipro course.  Check CMP, lipase, CBC today. Zofran 4mg  odt given today for nausea, sent home with zofran tab script prn nausea. Discussed liquid diet for next 1-2 days then bland diet as tolerated. Weight loss noted, but pt reports UTD colonoscopy although I don't have report availabel to review. Red flags to return or seek urgent care discussed, advised if persistent discomfort/nausea or weight loss will need to return for further evaluation, would consider CT abd/pelvis.

## 2014-05-27 NOTE — Progress Notes (Signed)
Pre visit review using our clinic review tool, if applicable. No additional management support is needed unless otherwise documented below in the visit note. 

## 2014-05-27 NOTE — Progress Notes (Signed)
BP 124/64  Pulse 88  Temp(Src) 97.6 F (36.4 C) (Oral)  Wt 142 lb 4 oz (64.524 kg)   CC: nausea/constipation  Subjective:    Patient ID: Nancy Doyle, female    DOB: February 12, 1935, 79 y.o.   MRN: [id number]  HPI: Nancy Doyle is a 79 y.o. female presenting on 05/27/2014 for Nausea and Constipation   Pt of Dr Lupita Dawn here for acute visit. Seen here 05/12/2014 by Rollene Fare with 3d h/o LLQ, concern for diverticulitis, treated with 10d course of cipro/flagyl. She was unable to complete this regimen - stopped after 4 days 2/2 nausea/dry heaves. abx stopped 1.5 wks ago, but has had persistent nausea/dry heaves. + constipation over last few days, last BM this morning was small and firm. Appetite down, energy level down. Thinks she is staying well hydrated. This morning again noted LLQ discomfort. Has lost 3 lbs in last 2 weeks.   No fevers/chills, no diarrhea, no indigestion, no dysphagia.  No urinary sxs like dysuria, urgency or frequency.  She has been taking 2 dimenhydrinate 50mg  otc for nausea  Compliant with prilosec 10mg  daily. H/o diverticulitis 2005 She endorses having colonoscopy since at [facility] but unsure when, states normal.  Lab Results  Component Value Date   CREATININE 0.5 09/05/2013   Wt Readings from Last 3 Encounters:  05/27/14 142 lb 4 oz (64.524 kg)  05/12/14 145 lb (65.772 kg)  09/05/13 153 lb 12 oz (69.741 kg)    Relevant past medical, surgical, family and social history reviewed and updated as indicated.  Allergies and medications reviewed and updated. Current Outpatient Prescriptions on File Prior to Visit  Medication Sig  . ALPRAZolam (XANAX) 0.5 MG tablet Take 1 tablet (0.5 mg total) by mouth 2 (two) times daily as needed.  Marland Kitchen amLODipine (NORVASC) 10 MG tablet TAKE 1 TABLET (10 MG TOTAL) BY MOUTH DAILY.  . Calcium Carbonate-Vitamin D (CALCIUM + D) 600-200 MG-UNIT TABS Take 1 tablet by mouth daily.    . hydrochlorothiazide (HYDRODIURIL) 12.5 MG tablet Take  1 tablet (12.5 mg total) by mouth daily.  . multivitamin-iron-minerals-folic acid (CENTRUM) chewable tablet Chew 1 tablet by mouth daily.    Marland Kitchen omeprazole (PRILOSEC) 10 MG capsule Take 10 mg by mouth daily.    . travoprost, benzalkonium, (TRAVATAN) 0.004 % ophthalmic solution Place 1 drop into both eyes at bedtime.     No current facility-administered medications on file prior to visit.   Past Medical History  Diagnosis Date  . GI bleed Nov 2009    3  ulcers found, required transfusion, repeat endoscopy showed mild gastritis  . Hypercholesterolemia     refuses statin therapy  . Diverticulitis 2005    hospitalized  . Hypertension   . Osteoporosis   . Tobacco abuse   . Tobacco abuse counseling     Past Surgical History  Procedure Laterality Date  . Incontinence surgery  2003    Dr. Davis Gourd  . Appendectomy  Age 57  . Cataract extraction  Jan 2011    Dr. Wallace Going    Review of Systems Per HPI unless specifically indicated above    Objective:    BP 124/64  Pulse 88  Temp(Src) 97.6 F (36.4 C) (Oral)  Wt 142 lb 4 oz (64.524 kg)  Physical Exam  Nursing note and vitals reviewed. Constitutional: She appears well-developed and well-nourished. No distress.  HENT:  Mouth/Throat: Oropharynx is clear and moist. No oropharyngeal exudate.  Cardiovascular: Normal rate, regular rhythm, normal heart sounds and intact  distal pulses.   No murmur heard. Pulmonary/Chest: Effort normal and breath sounds normal. No respiratory distress. She has no wheezes. She has no rales.  Abdominal: Soft. Normal appearance. She exhibits no distension and no mass. Bowel sounds are increased. There is no hepatosplenomegaly. There is tenderness (mild) in the suprapubic area and left lower quadrant. There is no rigidity, no rebound, no guarding, no CVA tenderness and negative Murphy's sign.  Musculoskeletal: She exhibits no edema.  Skin: Skin is warm and dry. No rash noted.  Psychiatric: She has a normal  mood and affect.       Assessment & Plan:   Problem List Items Addressed This Visit   Abdominal pain, left lower quadrant - Primary     Suspicion for incompletely treated diverticulitis. Only able to tolerate 4d cipro+flagyl, anticipate flagyl or combination led to nausea/dry heaves. Pt states she has tolerated cipro alone well in past so advised to restart cipro course.  Check CMP, lipase, CBC today. Zofran 4mg  odt given today for nausea, sent home with zofran tab script prn nausea. Discussed liquid diet for next 1-2 days then bland diet as tolerated. Weight loss noted, but pt reports UTD colonoscopy although I don't have report availabel to review. Red flags to return or seek urgent care discussed, advised if persistent discomfort/nausea or weight loss will need to return for further evaluation, would consider CT abd/pelvis.    Relevant Orders      Lipase      Comprehensive metabolic panel      CBC with Differential    Other Visit Diagnoses   Nausea without vomiting            Follow up plan: Return if symptoms worsen or fail to improve.

## 2014-05-29 ENCOUNTER — Other Ambulatory Visit: Payer: Self-pay | Admitting: Family Medicine

## 2014-05-29 ENCOUNTER — Telehealth: Payer: Self-pay

## 2014-05-29 DIAGNOSIS — R110 Nausea: Secondary | ICD-10-CM

## 2014-05-29 DIAGNOSIS — R7401 Elevation of levels of liver transaminase levels: Secondary | ICD-10-CM

## 2014-05-29 DIAGNOSIS — R740 Nonspecific elevation of levels of transaminase and lactic acid dehydrogenase [LDH]: Secondary | ICD-10-CM

## 2014-05-29 DIAGNOSIS — R109 Unspecified abdominal pain: Secondary | ICD-10-CM

## 2014-05-29 NOTE — Telephone Encounter (Signed)
See lab result note.

## 2014-05-29 NOTE — Telephone Encounter (Signed)
Pt was seen on 05/27/14; pt restarted Cipro but pt continues with N&V, pt was to cb if nausea reoccurred. Pt request cb.[facility].

## 2014-06-02 ENCOUNTER — Telehealth: Payer: Self-pay

## 2014-06-02 ENCOUNTER — Telehealth: Payer: Self-pay | Admitting: *Deleted

## 2014-06-02 ENCOUNTER — Encounter: Payer: Self-pay | Admitting: Family Medicine

## 2014-06-02 ENCOUNTER — Ambulatory Visit: Payer: Self-pay | Admitting: Family Medicine

## 2014-06-02 DIAGNOSIS — R160 Hepatomegaly, not elsewhere classified: Secondary | ICD-10-CM

## 2014-06-02 NOTE — Telephone Encounter (Signed)
Spoke with Dr Derrel Nip, spoke with patient and discussed preliminary results of abd [place]. Will order CT abd/pelvis with contrast to further eval for metastatic disease. Pt agrees with plan, has appt already scheduled with Dr. Derrel Nip on Wednesday - we will try and schedule CT scan for today or tomorrow.

## 2014-06-02 NOTE — Telephone Encounter (Signed)
[facility] Radiology called report on patient: ? Gallbladder filled with stones and wall echo shadow complex. Nodular appearing liver with multiple nodular areas of abnormal echogenicity highly suspicious for multiple hepatic masses/metastatic disease. Though cirrhotic liver with multiple nodules could have similar appearance. Further eval by CT with IV and oral contrast recommended. They are faxing report as well.

## 2014-06-02 NOTE — Telephone Encounter (Signed)
See next phone note.

## 2014-06-02 NOTE — Telephone Encounter (Signed)
Amber with [facility] [place] of abdomen; gallstones,small aneurysm and multiple masses in liver. Called report given to Dr Darnell Level and Luetta Nutting will change status to stat and will fax over report.

## 2014-06-02 NOTE — Telephone Encounter (Signed)
Late entry - spoke with patient ~6pm today with results of CT scan -  Innumerable liver lesions, several spleen lesions, one lung nodule, and ?spine lesion. Several possible primary sites throughout GI tract due to abnormal wall thickening - stomach, colon, and rectum. CT report will be faxed to our office and we will STAT scan into system. rec keep appt with PCP on Wednesday, continue zofran prn nausea.

## 2014-06-03 ENCOUNTER — Encounter: Payer: Self-pay | Admitting: Family Medicine

## 2014-06-04 ENCOUNTER — Ambulatory Visit (INDEPENDENT_AMBULATORY_CARE_PROVIDER_SITE_OTHER): Payer: Medicare Other | Admitting: Internal Medicine

## 2014-06-04 ENCOUNTER — Encounter: Payer: Self-pay | Admitting: Internal Medicine

## 2014-06-04 VITALS — BP 106/66 | HR 83 | Temp 97.4°F | Resp 16 | Ht 65.0 in | Wt 139.8 lb

## 2014-06-04 DIAGNOSIS — R933 Abnormal findings on diagnostic imaging of other parts of digestive tract: Secondary | ICD-10-CM

## 2014-06-04 DIAGNOSIS — C801 Malignant (primary) neoplasm, unspecified: Secondary | ICD-10-CM

## 2014-06-04 DIAGNOSIS — C7889 Secondary malignant neoplasm of other digestive organs: Secondary | ICD-10-CM

## 2014-06-04 DIAGNOSIS — R112 Nausea with vomiting, unspecified: Secondary | ICD-10-CM

## 2014-06-04 DIAGNOSIS — F411 Generalized anxiety disorder: Secondary | ICD-10-CM

## 2014-06-04 DIAGNOSIS — I10 Essential (primary) hypertension: Secondary | ICD-10-CM

## 2014-06-04 DIAGNOSIS — R160 Hepatomegaly, not elsewhere classified: Secondary | ICD-10-CM

## 2014-06-04 LAB — COMPREHENSIVE METABOLIC PANEL
ALT: 174 U/L — ABNORMAL HIGH (ref 0–35)
AST: 264 U/L — AB (ref 0–37)
Albumin: 3.4 g/dL — ABNORMAL LOW (ref 3.5–5.2)
Alkaline Phosphatase: 214 U/L — ABNORMAL HIGH (ref 39–117)
BUN: 17 mg/dL (ref 6–23)
CALCIUM: 9.2 mg/dL (ref 8.4–10.5)
CHLORIDE: 94 meq/L — AB (ref 96–112)
CO2: 30 mEq/L (ref 19–32)
Creatinine, Ser: 1.1 mg/dL (ref 0.4–1.2)
GFR: 51.19 mL/min — AB (ref >60.00)
Glucose, Bld: 98 mg/dL (ref 70–99)
Potassium: 3.7 mEq/L (ref 3.5–5.1)
Sodium: 138 mEq/L (ref 135–145)
Total Bilirubin: 1.2 mg/dL (ref 0.2–1.2)
Total Protein: 8.1 g/dL (ref 6.0–8.3)

## 2014-06-04 MED ORDER — DIAZEPAM 5 MG PO TABS: 5.0000 mg | ORAL_TABLET | Freq: Two times a day (BID) | ORAL | Status: DC | PRN

## 2014-06-04 MED ORDER — ALPRAZOLAM 0.5 MG PO TABS: 0.5000 mg | ORAL_TABLET | Freq: Two times a day (BID) | ORAL | Status: DC | PRN

## 2014-06-04 MED ORDER — HYDROCHLOROTHIAZIDE 12.5 MG PO TABS
12.5000 mg | ORAL_TABLET | Freq: Every day | ORAL | Status: DC
Start: 2014-06-04 — End: 2014-12-18

## 2014-06-04 MED ORDER — PANTOPRAZOLE SODIUM 40 MG PO TBEC: 40.0000 mg | DELAYED_RELEASE_TABLET | Freq: Two times a day (BID) | ORAL | Status: DC

## 2014-06-04 MED ORDER — HYDROCODONE-ACETAMINOPHEN 5-325 MG PO TABS: 1.0000 | ORAL_TABLET | Freq: Two times a day (BID) | ORAL | Status: DC | PRN

## 2014-06-04 NOTE — Patient Instructions (Addendum)
I am treating your anxiety with valium every 12  Hours ,  Use the alprazolam only as needed for in between anxiety or nighttime insomnia  I am changing your stomach medication to protonix,  Once you finish your prilosec, take 2 prilosec twice daily until gone , then start protonix one twice daily  Referral to [facility]  For colonoscopy and endoscopy  vicodin twice daily for your back pain,     No more anacin or prilosec  i WILL order the CT chest once I see the labs from today

## 2014-06-04 NOTE — Progress Notes (Signed)
Pre-visit discussion using our clinic review tool. No additional management support is needed unless otherwise documented below in the visit note.  

## 2014-06-04 NOTE — Progress Notes (Signed)
Patient ID: Nancy Doyle, female   DOB: [birth year], 79 y.o.   MRN: [id number]   Patient Active Problem List   Diagnosis Date Noted  . 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 Abdominal pain, left lower quadrant 05/27/2014  . Visit for preventive health examination 09/07/2013  . Glaucoma 09/05/2013  . Cataract extraction status 09/05/2013  . Chronic back pain greater than 3 months duration 05/06/2013  . Tobacco abuse 04/29/2011  . Tobacco abuse counseling 04/29/2011  . Generalized anxiety disorder 04/29/2011  . Glaucoma 04/29/2011  . Other and unspecified hyperlipidemia 04/28/2011  . Essential hypertension, benign 04/28/2011  . Diverticulosis 04/28/2011  . Upper GI bleed 04/28/2011  . Osteoporosis 04/28/2011    Subjective:  CC:   Chief Complaint  Patient presents with  . Follow-up    From Ct scan at hospital. difficulty swallowing and nausea.  . Nausea    Cannnot swallow, eats one or two bites and then wants to come back up    HPI:   Nancy Doyle is a 79 y.o. female who presents for Abnormal CT of abdomen and pelvis suggesting metastatic CA from unknown primary.  Patiently recently presented with recurrent nausea with vomiting for the past month.  She was evaluated by NP at [facility],  diagnosed with presumed diverticulitis and treated empicially with cipro and flagyl which made her more sick.  A follow up evaluation by Dr. Dani Gobble resulted in ultrasound of abdomen and  CT of abd and pelvis  Which was concerning for a liver that had been nearly completely replaced  with nodules, along with suspicious lesions in the spleen, a nodule in the right lower lobe of lung, and thickening of the stomach wall and rectal wall,  concerning for metastatic Ca from GI sources.  Reproductive organs were reported as unremarkable.  Her last colonoscopy was  Normal at age 74 in 2005 , done at [facility], and her last mammogram was normal  February 2015.  She is a lifelong tobacco user.   She has a history of gastric ulcer 2009,  Was using anacin for back pain up to 3 daily until last week when the nausea became severe.   She has lost  14 l Lbs since her last visit in January 2015, unintentionally .  She is accompanied by her daughter today.    Past Medical History  Diagnosis Date  . GI bleed Nov 2009    3  ulcers found, required transfusion, repeat endoscopy showed mild gastritis  . Hypercholesterolemia     refuses statin therapy  . Diverticulitis 2005    hospitalized  . Hypertension   . Osteoporosis   . Tobacco abuse   . Tobacco abuse counseling     Past Surgical History  Procedure Laterality Date  . Incontinence surgery  2003    Dr. Davis Gourd  . Appendectomy  Age 15  . Cataract extraction  Jan 2011    Dr. Wallace Going       The following portions of the patient's history were reviewed and updated as appropriate: Allergies, current medications, and problem list.    Review of Systems:   Patient denies headache, fevers, malaise, unintentional weight loss, skin rash, eye pain, sinus congestion and sinus pain, sore throat, dysphagia,  hemoptysis , cough, dyspnea, wheezing, chest pain, palpitations, orthopnea, edema, abdominal pain, nausea, melena, diarrhea, constipation, flank pain, dysuria, hematuria, urinary  Frequency, nocturia, numbness, tingling, seizures,  Focal weakness, Loss of consciousness,  Tremor, insomnia, depression,  anxiety, and suicidal ideation.     History   Social History  . Marital Status: Divorced    Spouse Name: N/A    Number of Children: N/A  . Years of Education: N/A   Occupational History  . Retired    Social History Main Topics  . Smoking status: Current Every Day Smoker -- 0.50 packs/day for 15 years    Types: Cigarettes  . Smokeless tobacco: Never Used  . Alcohol Use: No  . Drug Use: No  . Sexual Activity: Not Currently   Other Topics Concern  . Not on file   Social  History Narrative  . No narrative on file    Objective:  Filed Vitals:   06/04/14 1032  BP: 106/66  Pulse: 83  Temp: 97.4 F (36.3 C)  Resp: 16     General appearance: alert, cooperative and appears stated age Ears: normal TM's and external ear canals both ears Throat: lips, mucosa, and tongue normal; teeth and gums normal Neck: no adenopathy, no carotid bruit, supple, symmetrical, trachea midline and thyroid not enlarged, symmetric, no tenderness/mass/nodules Back: symmetric, no curvature. ROM normal. No CVA tenderness. Lungs: clear to auscultation bilaterally Heart: regular rate and rhythm, S1, S2 normal, no murmur, click, rub or gallop Abdomen: soft, non-tender; bowel sounds normal; no masses,  no organomegaly Pulses: 2+ and symmetric Skin: Skin color, texture, turgor normal. No rashes or lesions Lymph nodes: Cervical, supraclavicular, and axillary nodes normal.  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 this week with liver and spleen nodules noted, along with gastric wall and rectal wall thickening.  Referral to [facility] for EGD/colonoscopy made; her appt is next Wednesday.  CA 125 and CEA are both elevated, as are liver enzymes.  Referral to the [place] to be done as well to coordinate workup. Patient is understandable anxious,  Has been given an rx for valium to help manage her anxiety and nausea as she has had no relief with zofran.   Lab Results  Component Value Date   CA125 78* 06/04/2014   Lab Results  Component Value Date   CEA 80.7* 06/04/2014     Lab Results  Component Value Date   ALT 174* 06/04/2014   AST 264* 06/04/2014   ALKPHOS 214* 06/04/2014   BILITOT 1.2 06/04/2014   Lab Results  Component Value Date   WBC 8.1 05/27/2014   HGB 13.7 05/27/2014   HCT 43.4 05/27/2014   MCV 91.3 05/27/2014   PLT 306.0 05/27/2014        Updated Medication List Outpatient Encounter Prescriptions as of 06/04/2014  Medication Sig  . ALPRAZolam (XANAX) 0.5 MG tablet Take 1 tablet (0.5 mg total) by mouth 2 (two) times daily as needed.  Marland Kitchen amLODipine (NORVASC) 10 MG tablet TAKE 1 TABLET (10 MG TOTAL) BY MOUTH DAILY.  . hydrochlorothiazide (HYDRODIURIL) 12.5 MG tablet Take 1 tablet (12.5 mg total) by mouth daily.  . ondansetron (ZOFRAN) 4 MG tablet Take 1 tablet (4 mg total) by mouth 3 (three) times daily as needed for nausea or vomiting.  . travoprost, benzalkonium, (TRAVATAN) 0.004 % ophthalmic solution Place 1 drop into both eyes at bedtime.    . [DISCONTINUED] ALPRAZolam (XANAX) 0.5 MG tablet Take 1 tablet (0.5 mg total) by mouth 2 (two) times daily as needed.  . [DISCONTINUED] hydrochlorothiazide (HYDRODIURIL) 12.5 MG tablet Take 1 tablet (12.5 mg total) by mouth daily.  . [  DISCONTINUED] omeprazole (PRILOSEC) 10 MG capsule Take 10 mg by mouth daily.    . Calcium Carbonate-Vitamin D (CALCIUM + D) 600-200 MG-UNIT TABS Take 1 tablet by mouth daily.    . ciprofloxacin (CIPRO) 500 MG tablet Take 1 tablet (500 mg total) by mouth 2 (two) times daily.  . diazepam (VALIUM) 5 MG tablet Take 1 tablet (5 mg total) by mouth every 12 (twelve) hours as needed for anxiety.  Marland Kitchen HYDROcodone-acetaminophen (NORCO/VICODIN) 5-325 MG per tablet Take 1 tablet by mouth every 12 (twelve) hours as needed for moderate pain.  . multivitamin-iron-minerals-folic acid (CENTRUM) chewable tablet Chew 1 tablet by mouth daily.    . pantoprazole (PROTONIX) 40 MG tablet Take 1 tablet (40 mg total) by mouth 2 (two) times daily.     Orders Placed This Encounter  Procedures  . CA 125  . CEA  . Comp Met (CMET)  . Ambulatory referral to Gastroenterology  . Ambulatory referral to Oncology    No Follow-up on file.

## 2014-06-05 ENCOUNTER — Telehealth: Payer: Self-pay | Admitting: Internal Medicine

## 2014-06-05 ENCOUNTER — Other Ambulatory Visit: Payer: Self-pay | Admitting: Family Medicine

## 2014-06-05 ENCOUNTER — Telehealth: Payer: Self-pay

## 2014-06-05 DIAGNOSIS — IMO0001 Reserved for inherently not codable concepts without codable children: Secondary | ICD-10-CM | POA: Insufficient documentation

## 2014-06-05 DIAGNOSIS — C3490 Malignant neoplasm of unspecified part of unspecified bronchus or lung: Secondary | ICD-10-CM | POA: Insufficient documentation

## 2014-06-05 LAB — CA 125: CA 125: 78 U/mL — AB (ref <35)

## 2014-06-05 LAB — CEA: CEA: 80.7 ng/mL — ABNORMAL HIGH (ref 0.0–5.0)

## 2014-06-05 NOTE — Assessment & Plan Note (Addendum)
Suggested by CT of abdomen and pelvis done at [facility] [place] this week with liver and spleen nodules noted, along with dysphagia, gastric wall and rectal wall thickening.  Referral to [facility] for EGD/colonoscopy made; her appt is next Wednesday.  CA 125 and CEA are both elevated, as are liver enzymes.  Referral to the [place] to be done as well to coordinate workup. Patient is understandably anxious,  And has been given an rx for valium to help manage her anxiety and nausea as she has had no relief with zofran. Advised to supplement diet with Ensure/BoostCarnation instant breakfast drink to avoid continued weight loss and development of malnutrition.   Lab Results  Component Value Date   CA125 78* 06/04/2014   Lab Results  Component Value Date   CEA 80.7* 06/04/2014     Lab Results  Component Value Date   ALT 174* 06/04/2014   AST 264* 06/04/2014   ALKPHOS 214* 06/04/2014   BILITOT 1.2 06/04/2014   Lab Results  Component Value Date   WBC 8.1 05/27/2014   HGB 13.7 05/27/2014   HCT 43.4 05/27/2014   MCV 91.3 05/27/2014   PLT 306.0 05/27/2014

## 2014-06-05 NOTE — Telephone Encounter (Signed)
Pt left v/m requesting refill Zofran to [facility];pt recently diagnosed with CA. Dr Danise Mina is out of office the rest of this week and I spoke with Lorriane Shire at Dr Lupita Dawn office and advised to send this phone note to [facility]. Pt notified refill request sent to [facility] office. Pt states has # 10 pills left and pt takes 3 pills per day. Pt has appt with Dr Vira Agar, GI next week. Pt request cb.

## 2014-06-05 NOTE — Telephone Encounter (Signed)
emmi mailed  °

## 2014-06-06 MED ORDER — ONDANSETRON HCL 4 MG PO TABS: 4.0000 mg | ORAL_TABLET | Freq: Three times a day (TID) | ORAL | Status: DC | PRN

## 2014-06-06 NOTE — Telephone Encounter (Signed)
Notified Rx sent to pharmacy

## 2014-06-06 NOTE — Telephone Encounter (Signed)
Refill sent this morning

## 2014-06-09 ENCOUNTER — Telehealth: Payer: Self-pay

## 2014-06-09 ENCOUNTER — Emergency Department: Payer: Self-pay | Admitting: Emergency Medicine

## 2014-06-09 LAB — COMPREHENSIVE METABOLIC PANEL
ALBUMIN: 3.1 g/dL — AB (ref 3.4–5.0)
ALT: 193 U/L — AB
Alkaline Phosphatase: 351 U/L — ABNORMAL HIGH
Anion Gap: 13 (ref 7–16)
BUN: 17 mg/dL (ref 7–18)
Bilirubin,Total: 1.8 mg/dL — ABNORMAL HIGH (ref 0.2–1.0)
CALCIUM: 9.2 mg/dL (ref 8.5–10.1)
CREATININE: 1.16 mg/dL (ref 0.60–1.30)
Chloride: 95 mmol/L — ABNORMAL LOW (ref 98–107)
Co2: 26 mmol/L (ref 21–32)
Glucose: 129 mg/dL — ABNORMAL HIGH (ref 65–99)
POTASSIUM: 4.1 mmol/L (ref 3.5–5.1)
SGOT(AST): 297 U/L — ABNORMAL HIGH (ref 15–37)
Sodium: 134 mmol/L — ABNORMAL LOW (ref 136–145)
TOTAL PROTEIN: 8 g/dL (ref 6.4–8.2)

## 2014-06-09 LAB — CBC WITH DIFFERENTIAL/PLATELET
Basophil #: 0 10*3/uL (ref 0.0–0.1)
Basophil %: 0.3 %
Eosinophil #: 0 10*3/uL (ref 0.0–0.7)
Eosinophil %: 0.1 %
HCT: 39.7 % (ref 35.0–47.0)
HGB: 12.8 g/dL (ref 12.0–16.0)
LYMPHS ABS: 1.4 10*3/uL (ref 1.0–3.6)
LYMPHS PCT: 12.1 %
MCH: 29.2 pg (ref 26.0–34.0)
MCHC: 32.3 g/dL (ref 32.0–36.0)
MCV: 91 fL (ref 80–100)
Monocyte #: 3.5 x10 3/mm — ABNORMAL HIGH (ref 0.2–0.9)
Monocyte %: 31 %
NEUTROS PCT: 56.5 %
Neutrophil #: 6.4 10*3/uL (ref 1.4–6.5)
PLATELETS: 280 10*3/uL (ref 150–440)
RBC: 4.39 10*6/uL (ref 3.80–5.20)
RDW: 16.1 % — ABNORMAL HIGH (ref 11.5–14.5)
WBC: 11.4 10*3/uL — ABNORMAL HIGH (ref 3.6–11.0)

## 2014-06-09 LAB — URINALYSIS, COMPLETE
Blood: NEGATIVE
Glucose,UR: NEGATIVE mg/dL (ref 0–75)
NITRITE: NEGATIVE
PH: 5 (ref 4.5–8.0)
Protein: 30
SPECIFIC GRAVITY: 1.018 (ref 1.003–1.030)

## 2014-06-09 LAB — TROPONIN I: Troponin-I: <0.02 ng/mL

## 2014-06-09 LAB — LIPASE, BLOOD: Lipase: 324 U/L (ref 73–393)

## 2014-06-09 NOTE — Telephone Encounter (Signed)
Patient advised of recommendations and stated she has been taking the Zofran every 8 hours and is still not able to keep anything down. Patient is going to ED.

## 2014-06-09 NOTE — Telephone Encounter (Signed)
The zofran was sent in on oct 23rd,  Has she been taking it 3 times daily? If she has,  she will need to go to ER bc I have nothing stronger to prescribe and she may be obstructed

## 2014-06-09 NOTE — Telephone Encounter (Signed)
Please advise patient nausea and vomiting have increased

## 2014-06-09 NOTE — Telephone Encounter (Signed)
The patient called and stated she was having increased nausea and vomiting.  She is hoping something can be called in to help with this. (she was seen by Dr.Gutierrez, but she has run out of the medicine he prescribed) (She has also seen Dr.Tullo since then)

## 2014-06-11 LAB — URINE CULTURE

## 2014-06-12 ENCOUNTER — Ambulatory Visit: Payer: Self-pay | Admitting: Internal Medicine

## 2014-06-12 LAB — CBC CANCER CENTER
Bands: 1 %
HCT: 39.8 % (ref 35.0–47.0)
HGB: 13.3 g/dL (ref 12.0–16.0)
Lymphocytes: 9 %
MCH: 30.1 pg (ref 26.0–34.0)
MCHC: 33.5 g/dL (ref 32.0–36.0)
MCV: 90 fL (ref 80–100)
METAMYELOCYTE: 1 %
Monocytes: 21 %
Platelet: 331 x10 3/mm (ref 150–440)
RBC: 4.44 10*6/uL (ref 3.80–5.20)
RDW: 16.3 % — ABNORMAL HIGH (ref 11.5–14.5)
SEGMENTED NEUTROPHILS: 68 %
WBC: 9.7 x10 3/mm (ref 3.6–11.0)

## 2014-06-12 LAB — CREATININE, SERUM
Creatinine: 1.38 mg/dL — ABNORMAL HIGH (ref 0.60–1.30)
EGFR (African American): 48 — ABNORMAL LOW
GFR CALC NON AF AMER: 39 — AB

## 2014-06-12 LAB — PROTIME-INR
INR: 1
Prothrombin Time: 13.3 secs (ref 11.5–14.7)

## 2014-06-12 LAB — CALCIUM: Calcium, Total: 9.2 mg/dL (ref 8.5–10.1)

## 2014-06-12 LAB — APTT: Activated PTT: 36.1 secs — ABNORMAL HIGH (ref 23.6–35.9)

## 2014-06-15 ENCOUNTER — Ambulatory Visit: Payer: Self-pay | Admitting: Internal Medicine

## 2014-06-16 ENCOUNTER — Ambulatory Visit: Payer: Self-pay | Admitting: Unknown Physician Specialty

## 2014-06-23 ENCOUNTER — Ambulatory Visit: Payer: Self-pay | Admitting: Internal Medicine

## 2014-06-24 ENCOUNTER — Inpatient Hospital Stay: Payer: Self-pay | Admitting: Internal Medicine

## 2014-06-24 LAB — COMPREHENSIVE METABOLIC PANEL
ALK PHOS: 670 U/L — AB
ANION GAP: 16 (ref 7–16)
Albumin: 2.6 g/dL — ABNORMAL LOW (ref 3.4–5.0)
BUN: 27 mg/dL — ABNORMAL HIGH (ref 7–18)
Bilirubin,Total: 5.8 mg/dL — ABNORMAL HIGH (ref 0.2–1.0)
CREATININE: 1.5 mg/dL — AB (ref 0.60–1.30)
Calcium, Total: 8.5 mg/dL (ref 8.5–10.1)
Chloride: 93 mmol/L — ABNORMAL LOW (ref 98–107)
Co2: 25 mmol/L (ref 21–32)
EGFR (African American): 43 — ABNORMAL LOW
GFR CALC NON AF AMER: 36 — AB
GLUCOSE: 93 mg/dL (ref 65–99)
Osmolality: 273 (ref 275–301)
Potassium: 3.8 mmol/L (ref 3.5–5.1)
SGOT(AST): 369 U/L — ABNORMAL HIGH (ref 15–37)
SGPT (ALT): 136 U/L — ABNORMAL HIGH
Sodium: 134 mmol/L — ABNORMAL LOW (ref 136–145)
Total Protein: 6.7 g/dL (ref 6.4–8.2)

## 2014-06-24 LAB — CBC WITH DIFFERENTIAL/PLATELET
Comment - H1-Com2: NORMAL
HCT: 33 % — ABNORMAL LOW (ref 35.0–47.0)
HGB: 10.9 g/dL — AB (ref 12.0–16.0)
LYMPHS PCT: 10 %
MCH: 30.6 pg (ref 26.0–34.0)
MCHC: 32.9 g/dL (ref 32.0–36.0)
MCV: 93 fL (ref 80–100)
MONOS PCT: 20 %
PLATELETS: 362 10*3/uL (ref 150–440)
RBC: 3.56 10*6/uL — AB (ref 3.80–5.20)
RDW: 18.2 % — ABNORMAL HIGH (ref 11.5–14.5)
SEGMENTED NEUTROPHILS: 70 %
WBC: 12.3 10*3/uL — ABNORMAL HIGH (ref 3.6–11.0)

## 2014-06-25 LAB — CBC WITH DIFFERENTIAL/PLATELET
Bands: 2 %
HCT: 32 % — AB (ref 35.0–47.0)
HGB: 10.6 g/dL — ABNORMAL LOW (ref 12.0–16.0)
Lymphocytes: 7 %
MCH: 30.6 pg (ref 26.0–34.0)
MCHC: 33.2 g/dL (ref 32.0–36.0)
MCV: 92 fL (ref 80–100)
Monocytes: 31 %
Platelet: 334 10*3/uL (ref 150–440)
RBC: 3.48 10*6/uL — AB (ref 3.80–5.20)
RDW: 18.1 % — ABNORMAL HIGH (ref 11.5–14.5)
Segmented Neutrophils: 60 %
WBC: 14.9 10*3/uL — ABNORMAL HIGH (ref 3.6–11.0)

## 2014-06-25 LAB — BASIC METABOLIC PANEL
Anion Gap: 11 (ref 7–16)
BUN: 21 mg/dL — ABNORMAL HIGH (ref 7–18)
CHLORIDE: 94 mmol/L — AB (ref 98–107)
CO2: 29 mmol/L (ref 21–32)
CREATININE: 1.22 mg/dL (ref 0.60–1.30)
Calcium, Total: 7.9 mg/dL — ABNORMAL LOW (ref 8.5–10.1)
EGFR (African American): 55 — ABNORMAL LOW
EGFR (Non-African Amer.): 45 — ABNORMAL LOW
Glucose: 114 mg/dL — ABNORMAL HIGH (ref 65–99)
Osmolality: 272 (ref 275–301)
POTASSIUM: 3.4 mmol/L — AB (ref 3.5–5.1)
SODIUM: 134 mmol/L — AB (ref 136–145)

## 2014-06-27 LAB — BASIC METABOLIC PANEL
Anion Gap: 24 — ABNORMAL HIGH (ref 7–16)
BUN: 13 mg/dL (ref 7–18)
CO2: 24 mmol/L (ref 21–32)
CREATININE: 0.78 mg/dL (ref 0.60–1.30)
Calcium, Total: 6.7 mg/dL — CL (ref 8.5–10.1)
Chloride: 100 mmol/L (ref 98–107)
EGFR (Non-African Amer.): >60
GLUCOSE: 110 mg/dL — AB (ref 65–99)
Osmolality: 295 (ref 275–301)
Potassium: 4.1 mmol/L (ref 3.5–5.1)
Sodium: 148 mmol/L — ABNORMAL HIGH (ref 136–145)

## 2014-06-27 LAB — MAGNESIUM: MAGNESIUM: 1.6 mg/dL — AB

## 2014-06-29 LAB — BASIC METABOLIC PANEL
ANION GAP: 9 (ref 7–16)
BUN: 15 mg/dL (ref 7–18)
CO2: 30 mmol/L (ref 21–32)
CREATININE: 0.59 mg/dL — AB (ref 0.60–1.30)
Calcium, Total: 7.2 mg/dL — ABNORMAL LOW (ref 8.5–10.1)
Chloride: 96 mmol/L — ABNORMAL LOW (ref 98–107)
EGFR (African American): >60
EGFR (Non-African Amer.): >60
Glucose: 91 mg/dL (ref 65–99)
OSMOLALITY: 271 (ref 275–301)
POTASSIUM: 3.9 mmol/L (ref 3.5–5.1)
Sodium: 135 mmol/L — ABNORMAL LOW (ref 136–145)

## 2014-06-29 LAB — CBC WITH DIFFERENTIAL/PLATELET
HCT: 32.2 % — AB (ref 35.0–47.0)
HGB: 10.7 g/dL — ABNORMAL LOW (ref 12.0–16.0)
LYMPHS PCT: 13 %
MCH: 31.1 pg (ref 26.0–34.0)
MCHC: 33.2 g/dL (ref 32.0–36.0)
MCV: 94 fL (ref 80–100)
PLATELETS: 215 10*3/uL (ref 150–440)
RBC: 3.44 10*6/uL — AB (ref 3.80–5.20)
RDW: 19.6 % — AB (ref 11.5–14.5)
SEGMENTED NEUTROPHILS: 87 %
WBC: 8.2 10*3/uL (ref 3.6–11.0)

## 2014-06-29 LAB — MAGNESIUM: MAGNESIUM: 1.6 mg/dL — AB

## 2014-06-30 LAB — BASIC METABOLIC PANEL
BUN: 15 mg/dL (ref 4–21)
CREATININE: 0.6 mg/dL (ref 0.5–1.1)
Glucose: 91 mg/dL
Potassium: 3.9 mmol/L (ref 3.4–5.3)
Sodium: 135 mmol/L — AB (ref 137–147)

## 2014-06-30 LAB — CBC AND DIFFERENTIAL
Hemoglobin: 10.7 g/dL — AB (ref 12.0–16.0)
PLATELETS: 215 10*3/uL (ref 150–399)
WBC: 8.2 10*3/mL

## 2014-06-30 LAB — HEPATIC FUNCTION PANEL
ALT: 100 U/L — AB (ref 7–35)
AST: 100 U/L — AB (ref 13–35)
Alkaline Phosphatase: 670 U/L — AB (ref 25–125)
Bilirubin, Total: 5.8 mg/dL

## 2014-07-03 LAB — CBC CANCER CENTER
HCT: 26.5 % — ABNORMAL LOW (ref 35.0–47.0)
HGB: 8.8 g/dL — AB (ref 12.0–16.0)
Lymphocytes: 64 %
MCH: 31.5 pg (ref 26.0–34.0)
MCHC: 33.2 g/dL (ref 32.0–36.0)
MCV: 95 fL (ref 80–100)
Monocytes: 8 %
Platelet: 61 x10 3/mm — ABNORMAL LOW (ref 150–440)
RBC: 2.8 10*6/uL — ABNORMAL LOW (ref 3.80–5.20)
RDW: 20.8 % — ABNORMAL HIGH (ref 11.5–14.5)
SEGMENTED NEUTROPHILS: 28 %
WBC: 1.5 x10 3/mm — CL (ref 3.6–11.0)

## 2014-07-03 LAB — BASIC METABOLIC PANEL
ANION GAP: 9 (ref 7–16)
BUN: 15 mg/dL (ref 7–18)
CO2: 30 mmol/L (ref 21–32)
Calcium, Total: 9 mg/dL (ref 8.5–10.1)
Chloride: 96 mmol/L — ABNORMAL LOW (ref 98–107)
Creatinine: 0.8 mg/dL (ref 0.60–1.30)
EGFR (African American): >60
Glucose: 108 mg/dL — ABNORMAL HIGH (ref 65–99)
OSMOLALITY: 271 (ref 275–301)
POTASSIUM: 3.9 mmol/L (ref 3.5–5.1)
SODIUM: 135 mmol/L — AB (ref 136–145)

## 2014-07-09 LAB — BASIC METABOLIC PANEL
ANION GAP: 11 (ref 7–16)
BUN: 12 mg/dL (ref 7–18)
Calcium, Total: 9 mg/dL (ref 8.5–10.1)
Chloride: 95 mmol/L — ABNORMAL LOW (ref 98–107)
Co2: 31 mmol/L (ref 21–32)
Creatinine: 0.91 mg/dL (ref 0.60–1.30)
EGFR (African American): >60
EGFR (Non-African Amer.): >60
GLUCOSE: 117 mg/dL — AB (ref 65–99)
Osmolality: 275 (ref 275–301)
Potassium: 3.1 mmol/L — ABNORMAL LOW (ref 3.5–5.1)
SODIUM: 137 mmol/L (ref 136–145)

## 2014-07-09 LAB — CBC CANCER CENTER
BANDS NEUTROPHIL: 7 %
HCT: 26.2 % — AB (ref 35.0–47.0)
HGB: 8.5 g/dL — AB (ref 12.0–16.0)
Lymphocytes: 13 %
MCH: 31.2 pg (ref 26.0–34.0)
MCHC: 32.3 g/dL (ref 32.0–36.0)
MCV: 97 fL (ref 80–100)
MONOS PCT: 10 %
Metamyelocyte: 2 %
Myelocyte: 3 %
NRBC/100 WBC: 6 /100
Other Cells Blood: 10 %
Platelet: 176 x10 3/mm (ref 150–440)
RBC: 2.72 10*6/uL — AB (ref 3.80–5.20)
RDW: 22.8 % — ABNORMAL HIGH (ref 11.5–14.5)
Segmented Neutrophils: 55 %
WBC: 52.7 x10 3/mm — ABNORMAL HIGH (ref 3.6–11.0)

## 2014-07-15 ENCOUNTER — Ambulatory Visit: Payer: Self-pay | Admitting: Internal Medicine

## 2014-07-15 ENCOUNTER — Encounter: Payer: Self-pay | Admitting: Internal Medicine

## 2014-07-17 LAB — CBC CANCER CENTER
BASOS ABS: 0.1 x10 3/mm (ref 0.0–0.1)
Basophil %: 0.2 %
EOS ABS: 0 x10 3/mm (ref 0.0–0.7)
Eosinophil %: 0.1 %
HCT: 27.1 % — ABNORMAL LOW (ref 35.0–47.0)
HGB: 9 g/dL — AB (ref 12.0–16.0)
LYMPHS PCT: 5.8 %
Lymphocyte #: 2.2 x10 3/mm (ref 1.0–3.6)
MCH: 31.8 pg (ref 26.0–34.0)
MCHC: 33.1 g/dL (ref 32.0–36.0)
MCV: 96 fL (ref 80–100)
MONOS PCT: 14.7 %
Monocyte #: 5.6 x10 3/mm — ABNORMAL HIGH (ref 0.2–0.9)
NEUTROS PCT: 79.2 %
Neutrophil #: 30 x10 3/mm — ABNORMAL HIGH (ref 1.4–6.5)
Platelet: 989 x10 3/mm — ABNORMAL HIGH (ref 150–440)
RBC: 2.83 10*6/uL — ABNORMAL LOW (ref 3.80–5.20)
RDW: 24.6 % — AB (ref 11.5–14.5)
WBC: 37.9 x10 3/mm — ABNORMAL HIGH (ref 3.6–11.0)

## 2014-07-18 LAB — BASIC METABOLIC PANEL
Anion Gap: 16 (ref 7–16)
BUN: 21 mg/dL — ABNORMAL HIGH (ref 7–18)
CO2: 27 mmol/L (ref 21–32)
Calcium, Total: 9.1 mg/dL (ref 8.5–10.1)
Chloride: 94 mmol/L — ABNORMAL LOW (ref 98–107)
Creatinine: 1.24 mg/dL (ref 0.60–1.30)
EGFR (African American): 54 — ABNORMAL LOW
GFR CALC NON AF AMER: 45 — AB
Glucose: 128 mg/dL — ABNORMAL HIGH (ref 65–99)
OSMOLALITY: 278 (ref 275–301)
POTASSIUM: 3 mmol/L — AB (ref 3.5–5.1)
Sodium: 137 mmol/L (ref 136–145)

## 2014-07-18 LAB — HEPATIC FUNCTION PANEL A (ARMC)
Albumin: 2.9 g/dL — ABNORMAL LOW (ref 3.4–5.0)
Alkaline Phosphatase: 414 U/L — ABNORMAL HIGH
BILIRUBIN TOTAL: 1.2 mg/dL — AB (ref 0.2–1.0)
Bilirubin, Direct: 1 mg/dL — ABNORMAL HIGH (ref 0.0–0.2)
SGOT(AST): 49 U/L — ABNORMAL HIGH (ref 15–37)
SGPT (ALT): 37 U/L
TOTAL PROTEIN: 7.6 g/dL (ref 6.4–8.2)

## 2014-07-25 ENCOUNTER — Telehealth: Payer: Self-pay | Admitting: Internal Medicine

## 2014-07-25 NOTE — Telephone Encounter (Signed)
Please advise 

## 2014-07-25 NOTE — Telephone Encounter (Signed)
The patient was diagnosed with stage 4 lung cancer and she is needing to be seen by Dr. Derrel Nip I don't have any available appointments.

## 2014-07-27 NOTE — Telephone Encounter (Signed)
11:30 on  Wednesday

## 2014-07-28 LAB — CBC CANCER CENTER
BASOS ABS: 0.1 x10 3/mm (ref 0.0–0.1)
Basophil %: 0.8 %
EOS PCT: 0.6 %
Eosinophil #: 0 x10 3/mm (ref 0.0–0.7)
HCT: 25.6 % — ABNORMAL LOW (ref 35.0–47.0)
HGB: 8.3 g/dL — ABNORMAL LOW (ref 12.0–16.0)
LYMPHS PCT: 15.3 %
Lymphocyte #: 1 x10 3/mm (ref 1.0–3.6)
MCH: 32.4 pg (ref 26.0–34.0)
MCHC: 32.4 g/dL (ref 32.0–36.0)
MCV: 100 fL (ref 80–100)
MONO ABS: 0.4 x10 3/mm (ref 0.2–0.9)
Monocyte %: 6 %
NEUTROS PCT: 77.3 %
Neutrophil #: 4.9 x10 3/mm (ref 1.4–6.5)
Platelet: 390 x10 3/mm (ref 150–440)
RBC: 2.56 10*6/uL — ABNORMAL LOW (ref 3.80–5.20)
RDW: 21.7 % — AB (ref 11.5–14.5)
WBC: 6.3 x10 3/mm (ref 3.6–11.0)

## 2014-07-28 NOTE — Telephone Encounter (Signed)
Please add patient to schedule for 07/30/14 at 11.30 patient aware.

## 2014-07-28 NOTE — Telephone Encounter (Signed)
Patient aware.

## 2014-07-28 NOTE — Telephone Encounter (Signed)
She can continue the amlodipine or reduce dose by half the 10 mg drops her BP to < 120

## 2014-07-28 NOTE — Telephone Encounter (Signed)
Patient has a C/O increased BP patient was taken off BP meds by cancer center but has noticed last night increase in BP of 488 systolic but could not tell me diastolic or pulse rate, patient took an amlodipine does have an appointment 07/30/14 with you.

## 2014-07-28 NOTE — Telephone Encounter (Signed)
The patient has been scheduled

## 2014-07-30 ENCOUNTER — Encounter: Payer: Self-pay | Admitting: Internal Medicine

## 2014-07-30 ENCOUNTER — Ambulatory Visit (INDEPENDENT_AMBULATORY_CARE_PROVIDER_SITE_OTHER): Payer: Medicare Other | Admitting: Internal Medicine

## 2014-07-30 ENCOUNTER — Ambulatory Visit (INDEPENDENT_AMBULATORY_CARE_PROVIDER_SITE_OTHER): Payer: Medicare Other | Admitting: *Deleted

## 2014-07-30 VITALS — BP 128/72 | HR 95 | Temp 97.5°F | Resp 16 | Ht 63.0 in | Wt 123.8 lb

## 2014-07-30 DIAGNOSIS — M545 Low back pain, unspecified: Secondary | ICD-10-CM

## 2014-07-30 DIAGNOSIS — F5105 Insomnia due to other mental disorder: Secondary | ICD-10-CM

## 2014-07-30 DIAGNOSIS — Z23 Encounter for immunization: Secondary | ICD-10-CM

## 2014-07-30 DIAGNOSIS — F409 Phobic anxiety disorder, unspecified: Secondary | ICD-10-CM

## 2014-07-30 DIAGNOSIS — S22089A Unspecified fracture of T11-T12 vertebra, initial encounter for closed fracture: Secondary | ICD-10-CM

## 2014-07-30 DIAGNOSIS — IMO0001 Reserved for inherently not codable concepts without codable children: Secondary | ICD-10-CM

## 2014-07-30 DIAGNOSIS — I10 Essential (primary) hypertension: Secondary | ICD-10-CM

## 2014-07-30 DIAGNOSIS — C3491 Malignant neoplasm of unspecified part of right bronchus or lung: Secondary | ICD-10-CM

## 2014-07-30 MED ORDER — HYDROCODONE-ACETAMINOPHEN 10-325 MG PO TABS: 1.0000 | ORAL_TABLET | Freq: Four times a day (QID) | ORAL | Status: DC | PRN

## 2014-07-30 MED ORDER — ZOLPIDEM TARTRATE ER 6.25 MG PO TBCR: 6.2500 mg | EXTENDED_RELEASE_TABLET | Freq: Every evening | ORAL | Status: DC | PRN

## 2014-07-30 MED ORDER — PNEUMOCOCCAL 13-VAL CONJ VACC IM SUSP: 0.5000 mL | Freq: Once | INTRAMUSCULAR | Status: DC

## 2014-07-30 NOTE — Patient Instructions (Addendum)
I am changing your vicodin to a stronger form that  Will have you taking less tylenol  On a daily basis,  Which is better for your liver.  You can take one every  6 hours,  Up to 4 daily .  We can increase to 6 daily if needed.   I am giving you a "an  extended release' ambien to try when you finish your current ambien to help you sleep the whole night through . For now ,  You should Delay taking the alprazolam until you wake up in the early   morning  You may not need the alprazolam at night anymore once you try the extended release ambien   Check your BP once daily in the morning after breakfast.  You can resume the amlodipine on a daily basis if BP is  Consistently > 140.  Continue the amlodipine  daily unless your BP starts to drop below 100

## 2014-07-30 NOTE — Progress Notes (Addendum)
Patient ID: Nancy Doyle, female   DOB: Dec 26, 1934, 79 y.o.   MRN: [id number]  Patient Active Problem List   Diagnosis Date Noted  . T11 vertebral fracture 08/02/2014  . SCCA (small cell carcinoma of lung) 06/05/2014  . Visit for preventive health examination 09/07/2013  . Glaucoma 09/05/2013  . Cataract extraction status 09/05/2013  . Chronic back pain greater than 3 months duration 05/06/2013  . Tobacco abuse 04/29/2011  . Tobacco abuse counseling 04/29/2011  . Generalized anxiety disorder 04/29/2011  . Glaucoma 04/29/2011  . Other and unspecified hyperlipidemia 04/28/2011  . Essential hypertension, benign 04/28/2011  . Diverticulosis 04/28/2011  . Upper GI bleed 04/28/2011  . Osteoporosis 04/28/2011    Subjective:  CC:   Chief Complaint  Patient presents with  . Hypertension    HPI:   Nancy Doyle is a 79 y.o. female who presents for  Follow up on hypertension and new diagnosis of stage 4 lung Cancer.  The cancer was found in October when an abdominal ultrasound was done during workup for dysphagia with protracted nausea and vomiting and showed a liver that was completely replaced  With noduels and initially a GI or GYN source of CA was suspected,  GI evaluation was done with EGD and  Incomplete colonoscopy due to inadequate prep Verdie Shire, Nov 2).  Schatski's ring was dilated.  Nov 10th admitted to [facility] with dehydration secondary to N/V, and renal failure.   . Multiple attempts were made unsuccessfully to access [facility] hospital records since no DC summary was available.  The diagnosis of stage IV  Small cell lung Ca was ultimately made,  Head CT was negative for metastatic disease , and palliative treatment was started at the Cancer center in mid November.  Severla of here medications were held at dc including HCTZ and diazepam.  Hgb was 10.7,  Cr was 0.59, and Mg was 1.6 at discharge on Nov 15   She feels better than she did a month ago.  Appetite has improved and she  has regained some weight.  However she had a fall  3 weeks ago onto her tailbone, and her back pain has finally resolved after 2 weeks of severe unrelenting pain, but she notes that she has lost height since then.  She has not had palin films of her lower back, and notes that since the fall she has a lump in lower spinethat was not present before the fall.  She is taking Taking 6 to 8 vicodin daily for pain control.    She has also noticed in an increase in her blood pressure over the last several weeks .  She is taking amlodipine 10 mg daily      Past Medical History  Diagnosis Date  . GI bleed Nov 2009    3  ulcers found, required transfusion, repeat endoscopy showed mild gastritis  . Hypercholesterolemia     refuses statin therapy  . Diverticulitis 2005    hospitalized  . Hypertension   . Osteoporosis   . Tobacco abuse   . Tobacco abuse counseling     Past Surgical History  Procedure Laterality Date  . Incontinence surgery  2003    Dr. Davis Gourd  . Appendectomy  Age 38  . Cataract extraction  Jan 2011    Dr. Wallace Going       The following portions of the patient's history were reviewed and updated as appropriate: Allergies, current medications, and problem list.    Review of Systems:  Patient denies headache, fevers, malaise, unintentional weight loss, skin rash, eye pain, sinus congestion and sinus pain, sore throat, dysphagia,  hemoptysis , cough, dyspnea, wheezing, chest pain, palpitations, orthopnea, edema, abdominal pain, nausea, melena, diarrhea, constipation, flank pain, dysuria, hematuria, urinary  Frequency, nocturia, numbness, tingling, seizures,  Focal weakness, Loss of consciousness,  Tremor, insomnia, depression, anxiety, and suicidal ideation.     History   Social History  . Marital Status: Divorced    Spouse Name: N/A    Number of Children: N/A  . Years of Education: N/A   Occupational History  . Retired    Social History Main Topics  . Smoking  status: Current Every Day Smoker -- 0.50 packs/day for 15 years    Types: Cigarettes  . Smokeless tobacco: Never Used  . Alcohol Use: No  . Drug Use: No  . Sexual Activity: Not Currently   Other Topics Concern  . Not on file   Social History Narrative    Objective:  Filed Vitals:   07/30/14 1125  BP: 128/72  Pulse: 95  Temp: 97.5 F (36.4 C)  Resp: 16     General appearance: alert, cooperative and appears stated age Ears: normal TM's and external ear canals both ears Throat: lips, mucosa, and tongue normal; teeth and gums normal Neck: no adenopathy, no carotid bruit, supple, symmetrical, trachea midline and thyroid not enlarged, symmetric, no tenderness/mass/nodules Back: symmetric, no curvature. ROM normal. No CVA tenderness. Lungs: clear to auscultation bilaterally Heart: regular rate and rhythm, S1, S2 normal, no murmur, click, rub or gallop Abdomen: soft, non-tender; bowel sounds normal; no masses,  no organomegaly Pulses: 2+ and symmetric Skin: Skin color, texture, turgor normal. No rashes or lesions Lymph nodes: Cervical, supraclavicular, and axillary nodes normal.  Assessment and Plan:  Problem List Items Addressed This Visit      Cardiovascular and Mediastinum   Essential hypertension, benign    Both medications were held at discharge.  Advised to resume amlodipine only for SBP consistently > 150.       Respiratory   SCCA (small cell carcinoma of lung)    With liver and spleen metastases.  Undergoing palliative care at [facility] with Dr Ma Hillock.  Records unavailable at time of evaluation.     Relevant Medications      HYDROCODONE-ACETAMINOPHEN 10-325 MG PO TABS     Musculoskeletal and Integument   T11 vertebral fracture    Secondary to osteoporosis vs metastatic disease.  75% loss of height. 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  changing to Ambien CR and advised to suspend alprazolam at bedtime.     Relevant Medications      zolpidem (AMBIEN CR) CR tablet    Other Visit Diagnoses    Midline low back pain without sciatica        Relevant Medications       HYDROCODONE-ACETAMINOPHEN 10-325 MG PO TABS    Other Relevant Orders       DG Lumbar Spine Complete    Need for prophylactic vaccination against Streptococcus pneumoniae (pneumococcus)        Relevant Orders       Pneumococcal conjugate vaccine 13-valent (Completed)     ' A total of 40 minutes was spent with patient more  than half of which was spent in counseling patient on the above mentioned issues , reviewing and explaining recent labs and imaging studies done, and coordination of care.

## 2014-07-30 NOTE — Progress Notes (Signed)
Pre visit review using our clinic review tool, if applicable. No additional management support is needed unless otherwise documented below in the visit note. 

## 2014-07-31 ENCOUNTER — Ambulatory Visit: Payer: Self-pay | Admitting: Internal Medicine

## 2014-08-01 ENCOUNTER — Telehealth: Payer: Self-pay | Admitting: Internal Medicine

## 2014-08-01 DIAGNOSIS — M810 Age-related osteoporosis without current pathological fracture: Secondary | ICD-10-CM

## 2014-08-01 NOTE — Telephone Encounter (Signed)
Called and advised patient of results,  verbalized understanding. States pain is improving. Advised to call back with persistent or worsening symptoms.

## 2014-08-01 NOTE — Telephone Encounter (Signed)
X rays confirm that she has a new compression fracture involving T11 with 75% loss of height. If her pain is resolved,  No further workup.

## 2014-08-01 NOTE — Assessment & Plan Note (Signed)
X rays confirm that she has a new compression fracture involving T11 with 75% loss of height. If her pain is resolved,  No further workup.

## 2014-08-02 DIAGNOSIS — F409 Phobic anxiety disorder, unspecified: Secondary | ICD-10-CM | POA: Insufficient documentation

## 2014-08-02 DIAGNOSIS — S22089A Unspecified fracture of T11-T12 vertebra, initial encounter for closed fracture: Secondary | ICD-10-CM | POA: Insufficient documentation

## 2014-08-02 DIAGNOSIS — F5105 Insomnia due to other mental disorder: Principal | ICD-10-CM

## 2014-08-02 NOTE — Assessment & Plan Note (Signed)
Both medications were held at discharge.  Advised to resume amlodipine only for SBP consistently > 150.

## 2014-08-02 NOTE — Assessment & Plan Note (Signed)
With liver and spleen metastases.  Undergoing palliative care at [facility] with Dr Ma Hillock.  Records unavailable at time of evaluation.

## 2014-08-02 NOTE — Assessment & Plan Note (Signed)
She is currently taking 5 mg Ambien and her alprazolam at bedtime and having recurrent early awakening.  changing to Ambien CR and advised to suspend alprazolam at bedtime.

## 2014-08-02 NOTE — Assessment & Plan Note (Signed)
Secondary to osteoporosis vs metastatic disease.  75% loss of height. changing vicodin 5/325 to 10/325  For improved control with less acetonminophen

## 2014-08-02 NOTE — Addendum Note (Signed)
Addended by: Crecencio Mc on: 08/02/2014 03:06 PM   Modules accepted: Level of Service

## 2014-08-04 LAB — BASIC METABOLIC PANEL
Anion Gap: 11 (ref 7–16)
BUN: 7 mg/dL (ref 7–18)
CREATININE: 0.65 mg/dL (ref 0.60–1.30)
Calcium, Total: 8.3 mg/dL — ABNORMAL LOW (ref 8.5–10.1)
Chloride: 98 mmol/L (ref 98–107)
Co2: 28 mmol/L (ref 21–32)
EGFR (African American): >60
Glucose: 105 mg/dL — ABNORMAL HIGH (ref 65–99)
OSMOLALITY: 272 (ref 275–301)
Potassium: 2.8 mmol/L — ABNORMAL LOW (ref 3.5–5.1)
SODIUM: 137 mmol/L (ref 136–145)

## 2014-08-04 LAB — CBC CANCER CENTER
Bands: 10 %
Basophil: 1 %
HCT: 25.2 % — ABNORMAL LOW (ref 35.0–47.0)
HGB: 8 g/dL — AB (ref 12.0–16.0)
Lymphocytes: 23 %
MCH: 32.3 pg (ref 26.0–34.0)
MCHC: 31.8 g/dL — ABNORMAL LOW (ref 32.0–36.0)
MCV: 101 fL — ABNORMAL HIGH (ref 80–100)
Monocytes: 15 %
Myelocyte: 1 %
NRBC/100 WBC: 2 /100
Other Cells Blood: 2 %
PLATELETS: 81 x10 3/mm — AB (ref 150–440)
RBC: 2.48 10*6/uL — AB (ref 3.80–5.20)
RDW: 22.3 % — ABNORMAL HIGH (ref 11.5–14.5)
SEGMENTED NEUTROPHILS: 48 %
WBC: 41.5 x10 3/mm — AB (ref 3.6–11.0)

## 2014-08-04 LAB — HEPATIC FUNCTION PANEL A (ARMC)
ALBUMIN: 2.9 g/dL — AB (ref 3.4–5.0)
AST: 26 U/L (ref 15–37)
Alkaline Phosphatase: 295 U/L — ABNORMAL HIGH
BILIRUBIN DIRECT: 0.5 mg/dL — AB (ref 0.0–0.2)
Bilirubin,Total: 0.6 mg/dL (ref 0.2–1.0)
SGPT (ALT): 33 U/L
TOTAL PROTEIN: 7.2 g/dL (ref 6.4–8.2)

## 2014-08-05 ENCOUNTER — Telehealth: Payer: Self-pay | Admitting: Internal Medicine

## 2014-08-05 MED ORDER — HYDROCHLOROTHIAZIDE 12.5 MG PO TABS: 12.5000 mg | ORAL_TABLET | Freq: Every day | ORAL | Status: DC

## 2014-08-05 NOTE — Telephone Encounter (Signed)
She can resume starting with 12.5 mg daily,  rx sent to pharmacy

## 2014-08-05 NOTE — Telephone Encounter (Signed)
Ms. Nancy Doyle called saying her ankles have began swelling. It's been going on for a couple of days now. She said she was prescribed Hydrochlorothiazide a while back but hasn't been taking it due to having Lung Ca. She's wondering if she should begin taking it now. She's not sure if it would affect her negatively since she has Cancer. Please call the pt. Pt ph# [phone] Thank you.

## 2014-08-05 NOTE — Telephone Encounter (Signed)
Spoke to patient to notify her of Dr. Lupita Dawn office. Patient verbalized understanding.

## 2014-08-05 NOTE — Telephone Encounter (Signed)
Please review previous message and advise. Thank you

## 2014-08-11 LAB — CBC CANCER CENTER
BANDS NEUTROPHIL: 3 %
BASOS ABS: 0.1 x10 3/mm (ref 0.0–0.1)
Basophil %: 0.7 %
Comment - H1-Com5: NORMAL
Eosinophil #: 0.1 x10 3/mm (ref 0.0–0.7)
Eosinophil %: 0.3 %
HCT: 28.1 % — ABNORMAL LOW (ref 35.0–47.0)
HGB: 9 g/dL — ABNORMAL LOW (ref 12.0–16.0)
LYMPHS ABS: 3.3 x10 3/mm (ref 1.0–3.6)
Lymphocyte %: 14.9 %
Lymphocytes: 18 %
MCH: 33.6 pg (ref 26.0–34.0)
MCHC: 32 g/dL (ref 32.0–36.0)
MCV: 105 fL — AB (ref 80–100)
Monocyte #: 5 x10 3/mm — ABNORMAL HIGH (ref 0.2–0.9)
Monocyte %: 22.3 %
Monocytes: 26 %
NEUTROS ABS: 13.8 x10 3/mm — AB (ref 1.4–6.5)
Neutrophil %: 61.8 %
Platelet: 502 x10 3/mm — ABNORMAL HIGH (ref 150–440)
RBC: 2.68 10*6/uL — AB (ref 3.80–5.20)
RDW: 24.3 % — AB (ref 11.5–14.5)
Segmented Neutrophils: 53 %
WBC: 22.4 x10 3/mm — ABNORMAL HIGH (ref 3.6–11.0)

## 2014-08-11 LAB — BASIC METABOLIC PANEL
Anion Gap: 10 (ref 7–16)
BUN: 11 mg/dL (ref 7–18)
CHLORIDE: 99 mmol/L (ref 98–107)
CO2: 25 mmol/L (ref 21–32)
Calcium, Total: 9.2 mg/dL (ref 8.5–10.1)
Creatinine: 0.74 mg/dL (ref 0.60–1.30)
EGFR (Non-African Amer.): >60
GLUCOSE: 109 mg/dL — AB (ref 65–99)
Osmolality: 268 (ref 275–301)
POTASSIUM: 4.9 mmol/L (ref 3.5–5.1)
SODIUM: 134 mmol/L — AB (ref 136–145)

## 2014-08-15 ENCOUNTER — Ambulatory Visit: Payer: Self-pay | Admitting: Internal Medicine

## 2014-08-18 DIAGNOSIS — Z79899 Other long term (current) drug therapy: Secondary | ICD-10-CM | POA: Diagnosis not present

## 2014-08-18 DIAGNOSIS — Z85820 Personal history of malignant melanoma of skin: Secondary | ICD-10-CM | POA: Diagnosis not present

## 2014-08-18 DIAGNOSIS — M545 Low back pain: Secondary | ICD-10-CM | POA: Diagnosis not present

## 2014-08-18 DIAGNOSIS — F411 Generalized anxiety disorder: Secondary | ICD-10-CM | POA: Diagnosis not present

## 2014-08-18 DIAGNOSIS — C7951 Secondary malignant neoplasm of bone: Secondary | ICD-10-CM | POA: Diagnosis not present

## 2014-08-18 DIAGNOSIS — D649 Anemia, unspecified: Secondary | ICD-10-CM | POA: Diagnosis not present

## 2014-08-18 DIAGNOSIS — E785 Hyperlipidemia, unspecified: Secondary | ICD-10-CM | POA: Diagnosis not present

## 2014-08-18 DIAGNOSIS — I7090 Unspecified atherosclerosis: Secondary | ICD-10-CM | POA: Diagnosis not present

## 2014-08-18 DIAGNOSIS — R970 Elevated carcinoembryonic antigen [CEA]: Secondary | ICD-10-CM | POA: Diagnosis not present

## 2014-08-18 DIAGNOSIS — C781 Secondary malignant neoplasm of mediastinum: Secondary | ICD-10-CM | POA: Diagnosis not present

## 2014-08-18 DIAGNOSIS — D7389 Other diseases of spleen: Secondary | ICD-10-CM | POA: Diagnosis not present

## 2014-08-18 DIAGNOSIS — K5790 Diverticulosis of intestine, part unspecified, without perforation or abscess without bleeding: Secondary | ICD-10-CM | POA: Diagnosis not present

## 2014-08-18 DIAGNOSIS — R05 Cough: Secondary | ICD-10-CM | POA: Diagnosis not present

## 2014-08-18 DIAGNOSIS — D72829 Elevated white blood cell count, unspecified: Secondary | ICD-10-CM | POA: Diagnosis not present

## 2014-08-18 DIAGNOSIS — F17210 Nicotine dependence, cigarettes, uncomplicated: Secondary | ICD-10-CM | POA: Diagnosis not present

## 2014-08-18 DIAGNOSIS — I10 Essential (primary) hypertension: Secondary | ICD-10-CM | POA: Diagnosis not present

## 2014-08-18 DIAGNOSIS — D6959 Other secondary thrombocytopenia: Secondary | ICD-10-CM | POA: Diagnosis not present

## 2014-08-18 DIAGNOSIS — C787 Secondary malignant neoplasm of liver and intrahepatic bile duct: Secondary | ICD-10-CM | POA: Diagnosis not present

## 2014-08-18 DIAGNOSIS — M810 Age-related osteoporosis without current pathological fracture: Secondary | ICD-10-CM | POA: Diagnosis not present

## 2014-08-18 DIAGNOSIS — R971 Elevated cancer antigen 125 [CA 125]: Secondary | ICD-10-CM | POA: Diagnosis not present

## 2014-08-18 DIAGNOSIS — R0609 Other forms of dyspnea: Secondary | ICD-10-CM | POA: Diagnosis not present

## 2014-08-18 DIAGNOSIS — I77811 Abdominal aortic ectasia: Secondary | ICD-10-CM | POA: Diagnosis not present

## 2014-08-18 DIAGNOSIS — M4856XS Collapsed vertebra, not elsewhere classified, lumbar region, sequela of fracture: Secondary | ICD-10-CM | POA: Diagnosis not present

## 2014-08-18 DIAGNOSIS — C3491 Malignant neoplasm of unspecified part of right bronchus or lung: Secondary | ICD-10-CM | POA: Diagnosis not present

## 2014-08-18 DIAGNOSIS — Z5111 Encounter for antineoplastic chemotherapy: Secondary | ICD-10-CM | POA: Diagnosis not present

## 2014-08-18 LAB — CBC CANCER CENTER
BASOS ABS: 0.1 x10 3/mm (ref 0.0–0.1)
BASOS PCT: 0.3 %
EOS ABS: 0 x10 3/mm (ref 0.0–0.7)
Eosinophil %: 0.2 %
HCT: 27.3 % — AB (ref 35.0–47.0)
HGB: 8.9 g/dL — ABNORMAL LOW (ref 12.0–16.0)
Lymphocyte #: 1.6 x10 3/mm (ref 1.0–3.6)
Lymphocyte %: 10.2 %
MCH: 34.4 pg — ABNORMAL HIGH (ref 26.0–34.0)
MCHC: 32.5 g/dL (ref 32.0–36.0)
MCV: 106 fL — ABNORMAL HIGH (ref 80–100)
MONO ABS: 0.5 x10 3/mm (ref 0.2–0.9)
Monocyte %: 3 %
NEUTROS PCT: 86.3 %
Neutrophil #: 13.3 x10 3/mm — ABNORMAL HIGH (ref 1.4–6.5)
PLATELETS: 297 x10 3/mm (ref 150–440)
RBC: 2.58 10*6/uL — ABNORMAL LOW (ref 3.80–5.20)
RDW: 21.8 % — ABNORMAL HIGH (ref 11.5–14.5)
WBC: 15.5 x10 3/mm — AB (ref 3.6–11.0)

## 2014-08-18 LAB — BASIC METABOLIC PANEL
ANION GAP: 13 (ref 7–16)
BUN: 17 mg/dL (ref 7–18)
CHLORIDE: 95 mmol/L — AB (ref 98–107)
Calcium, Total: 9.4 mg/dL (ref 8.5–10.1)
Co2: 25 mmol/L (ref 21–32)
Creatinine: 1.13 mg/dL (ref 0.60–1.30)
EGFR (African American): >60
GFR CALC NON AF AMER: 50 — AB
GLUCOSE: 111 mg/dL — AB (ref 65–99)
OSMOLALITY: 269 (ref 275–301)
POTASSIUM: 4.8 mmol/L (ref 3.5–5.1)
Sodium: 133 mmol/L — ABNORMAL LOW (ref 136–145)

## 2014-08-28 DIAGNOSIS — C787 Secondary malignant neoplasm of liver and intrahepatic bile duct: Secondary | ICD-10-CM | POA: Diagnosis not present

## 2014-08-28 DIAGNOSIS — C3491 Malignant neoplasm of unspecified part of right bronchus or lung: Secondary | ICD-10-CM | POA: Diagnosis not present

## 2014-08-28 DIAGNOSIS — I77811 Abdominal aortic ectasia: Secondary | ICD-10-CM | POA: Diagnosis not present

## 2014-08-28 DIAGNOSIS — M4856XS Collapsed vertebra, not elsewhere classified, lumbar region, sequela of fracture: Secondary | ICD-10-CM | POA: Diagnosis not present

## 2014-08-28 DIAGNOSIS — I7090 Unspecified atherosclerosis: Secondary | ICD-10-CM | POA: Diagnosis not present

## 2014-08-28 DIAGNOSIS — Z85820 Personal history of malignant melanoma of skin: Secondary | ICD-10-CM | POA: Diagnosis not present

## 2014-08-28 DIAGNOSIS — R970 Elevated carcinoembryonic antigen [CEA]: Secondary | ICD-10-CM | POA: Diagnosis not present

## 2014-08-28 DIAGNOSIS — M810 Age-related osteoporosis without current pathological fracture: Secondary | ICD-10-CM | POA: Diagnosis not present

## 2014-08-28 DIAGNOSIS — C781 Secondary malignant neoplasm of mediastinum: Secondary | ICD-10-CM | POA: Diagnosis not present

## 2014-08-28 DIAGNOSIS — R971 Elevated cancer antigen 125 [CA 125]: Secondary | ICD-10-CM | POA: Diagnosis not present

## 2014-08-28 DIAGNOSIS — E785 Hyperlipidemia, unspecified: Secondary | ICD-10-CM | POA: Diagnosis not present

## 2014-08-28 DIAGNOSIS — F411 Generalized anxiety disorder: Secondary | ICD-10-CM | POA: Diagnosis not present

## 2014-08-28 DIAGNOSIS — D649 Anemia, unspecified: Secondary | ICD-10-CM | POA: Diagnosis not present

## 2014-08-28 DIAGNOSIS — M545 Low back pain: Secondary | ICD-10-CM | POA: Diagnosis not present

## 2014-08-28 DIAGNOSIS — K5790 Diverticulosis of intestine, part unspecified, without perforation or abscess without bleeding: Secondary | ICD-10-CM | POA: Diagnosis not present

## 2014-08-28 DIAGNOSIS — R0609 Other forms of dyspnea: Secondary | ICD-10-CM | POA: Diagnosis not present

## 2014-08-28 DIAGNOSIS — Z79899 Other long term (current) drug therapy: Secondary | ICD-10-CM | POA: Diagnosis not present

## 2014-08-28 DIAGNOSIS — F17210 Nicotine dependence, cigarettes, uncomplicated: Secondary | ICD-10-CM | POA: Diagnosis not present

## 2014-08-28 DIAGNOSIS — D72829 Elevated white blood cell count, unspecified: Secondary | ICD-10-CM | POA: Diagnosis not present

## 2014-08-28 DIAGNOSIS — D7389 Other diseases of spleen: Secondary | ICD-10-CM | POA: Diagnosis not present

## 2014-08-28 DIAGNOSIS — R05 Cough: Secondary | ICD-10-CM | POA: Diagnosis not present

## 2014-08-28 DIAGNOSIS — Z5111 Encounter for antineoplastic chemotherapy: Secondary | ICD-10-CM | POA: Diagnosis not present

## 2014-08-28 DIAGNOSIS — C7951 Secondary malignant neoplasm of bone: Secondary | ICD-10-CM | POA: Diagnosis not present

## 2014-08-28 DIAGNOSIS — I10 Essential (primary) hypertension: Secondary | ICD-10-CM | POA: Diagnosis not present

## 2014-08-28 DIAGNOSIS — D6959 Other secondary thrombocytopenia: Secondary | ICD-10-CM | POA: Diagnosis not present

## 2014-08-28 LAB — HEPATIC FUNCTION PANEL A (ARMC)
Albumin: 3.5 g/dL (ref 3.4–5.0)
Alkaline Phosphatase: 206 U/L — ABNORMAL HIGH
BILIRUBIN DIRECT: 0.3 mg/dL — AB (ref 0.0–0.2)
Bilirubin,Total: 0.5 mg/dL (ref 0.2–1.0)
SGOT(AST): 24 U/L (ref 15–37)
SGPT (ALT): 23 U/L
Total Protein: 7.9 g/dL (ref 6.4–8.2)

## 2014-08-28 LAB — BASIC METABOLIC PANEL
Anion Gap: 11 (ref 7–16)
BUN: 11 mg/dL (ref 7–18)
Calcium, Total: 8.8 mg/dL (ref 8.5–10.1)
Chloride: 105 mmol/L (ref 98–107)
Co2: 25 mmol/L (ref 21–32)
Creatinine: 0.92 mg/dL (ref 0.60–1.30)
GLUCOSE: 116 mg/dL — AB (ref 65–99)
OSMOLALITY: 282 (ref 275–301)
POTASSIUM: 4.7 mmol/L (ref 3.5–5.1)
Sodium: 141 mmol/L (ref 136–145)

## 2014-08-28 LAB — CBC CANCER CENTER
Bands: 12 %
Comment - H1-Com3: NORMAL
HCT: 27.6 % — ABNORMAL LOW (ref 35.0–47.0)
HGB: 8.4 g/dL — AB (ref 12.0–16.0)
Lymphocytes: 11 %
MCH: 33.5 pg (ref 26.0–34.0)
MCHC: 30.5 g/dL — AB (ref 32.0–36.0)
MCV: 110 fL — ABNORMAL HIGH (ref 80–100)
METAMYELOCYTE: 1 %
MYELOCYTE: 5 %
Monocytes: 6 %
NRBC/100 WBC: 6 /100
Platelet: 78 x10 3/mm — ABNORMAL LOW (ref 150–440)
RBC: 2.51 10*6/uL — ABNORMAL LOW (ref 3.80–5.20)
RDW: 22.9 % — ABNORMAL HIGH (ref 11.5–14.5)
Segmented Neutrophils: 65 %
WBC: 67.9 x10 3/mm — ABNORMAL HIGH (ref 3.6–11.0)

## 2014-09-08 DIAGNOSIS — I10 Essential (primary) hypertension: Secondary | ICD-10-CM | POA: Diagnosis not present

## 2014-09-08 DIAGNOSIS — Z79899 Other long term (current) drug therapy: Secondary | ICD-10-CM | POA: Diagnosis not present

## 2014-09-08 DIAGNOSIS — D72829 Elevated white blood cell count, unspecified: Secondary | ICD-10-CM | POA: Diagnosis not present

## 2014-09-08 DIAGNOSIS — M4856XS Collapsed vertebra, not elsewhere classified, lumbar region, sequela of fracture: Secondary | ICD-10-CM | POA: Diagnosis not present

## 2014-09-08 DIAGNOSIS — E785 Hyperlipidemia, unspecified: Secondary | ICD-10-CM | POA: Diagnosis not present

## 2014-09-08 DIAGNOSIS — I77811 Abdominal aortic ectasia: Secondary | ICD-10-CM | POA: Diagnosis not present

## 2014-09-08 DIAGNOSIS — C3491 Malignant neoplasm of unspecified part of right bronchus or lung: Secondary | ICD-10-CM | POA: Diagnosis not present

## 2014-09-08 DIAGNOSIS — F411 Generalized anxiety disorder: Secondary | ICD-10-CM | POA: Diagnosis not present

## 2014-09-08 DIAGNOSIS — M545 Low back pain: Secondary | ICD-10-CM | POA: Diagnosis not present

## 2014-09-08 DIAGNOSIS — C781 Secondary malignant neoplasm of mediastinum: Secondary | ICD-10-CM | POA: Diagnosis not present

## 2014-09-08 DIAGNOSIS — Z85820 Personal history of malignant melanoma of skin: Secondary | ICD-10-CM | POA: Diagnosis not present

## 2014-09-08 DIAGNOSIS — F17210 Nicotine dependence, cigarettes, uncomplicated: Secondary | ICD-10-CM | POA: Diagnosis not present

## 2014-09-08 DIAGNOSIS — Z5111 Encounter for antineoplastic chemotherapy: Secondary | ICD-10-CM | POA: Diagnosis not present

## 2014-09-08 DIAGNOSIS — M810 Age-related osteoporosis without current pathological fracture: Secondary | ICD-10-CM | POA: Diagnosis not present

## 2014-09-08 DIAGNOSIS — D7389 Other diseases of spleen: Secondary | ICD-10-CM | POA: Diagnosis not present

## 2014-09-08 DIAGNOSIS — K5790 Diverticulosis of intestine, part unspecified, without perforation or abscess without bleeding: Secondary | ICD-10-CM | POA: Diagnosis not present

## 2014-09-08 DIAGNOSIS — D6959 Other secondary thrombocytopenia: Secondary | ICD-10-CM | POA: Diagnosis not present

## 2014-09-08 DIAGNOSIS — R05 Cough: Secondary | ICD-10-CM | POA: Diagnosis not present

## 2014-09-08 DIAGNOSIS — C787 Secondary malignant neoplasm of liver and intrahepatic bile duct: Secondary | ICD-10-CM | POA: Diagnosis not present

## 2014-09-08 DIAGNOSIS — R970 Elevated carcinoembryonic antigen [CEA]: Secondary | ICD-10-CM | POA: Diagnosis not present

## 2014-09-08 DIAGNOSIS — C7951 Secondary malignant neoplasm of bone: Secondary | ICD-10-CM | POA: Diagnosis not present

## 2014-09-08 DIAGNOSIS — R971 Elevated cancer antigen 125 [CA 125]: Secondary | ICD-10-CM | POA: Diagnosis not present

## 2014-09-08 DIAGNOSIS — R0609 Other forms of dyspnea: Secondary | ICD-10-CM | POA: Diagnosis not present

## 2014-09-08 DIAGNOSIS — D649 Anemia, unspecified: Secondary | ICD-10-CM | POA: Diagnosis not present

## 2014-09-08 DIAGNOSIS — I7090 Unspecified atherosclerosis: Secondary | ICD-10-CM | POA: Diagnosis not present

## 2014-09-08 LAB — BASIC METABOLIC PANEL
Anion Gap: 8 (ref 7–16)
BUN: 13 mg/dL (ref 7–18)
CALCIUM: 9.3 mg/dL (ref 8.5–10.1)
Chloride: 103 mmol/L (ref 98–107)
Co2: 28 mmol/L (ref 21–32)
Creatinine: 0.77 mg/dL (ref 0.60–1.30)
GLUCOSE: 107 mg/dL — AB (ref 65–99)
OSMOLALITY: 278 (ref 275–301)
POTASSIUM: 4.4 mmol/L (ref 3.5–5.1)
SODIUM: 139 mmol/L (ref 136–145)

## 2014-09-08 LAB — CBC CANCER CENTER
BASOS PCT: 0.9 %
Basophil #: 0 x10 3/mm (ref 0.0–0.1)
EOS ABS: 0.1 x10 3/mm (ref 0.0–0.7)
Eosinophil %: 1.4 %
HCT: 30.2 % — AB (ref 35.0–47.0)
HGB: 9.8 g/dL — AB (ref 12.0–16.0)
LYMPHS PCT: 28.7 %
Lymphocyte #: 1.5 x10 3/mm (ref 1.0–3.6)
MCH: 36.1 pg — AB (ref 26.0–34.0)
MCHC: 32.5 g/dL (ref 32.0–36.0)
MCV: 111 fL — ABNORMAL HIGH (ref 80–100)
MONO ABS: 1.8 x10 3/mm — AB (ref 0.2–0.9)
Monocyte %: 33.7 %
NEUTROS ABS: 1.8 x10 3/mm (ref 1.4–6.5)
Neutrophil %: 35.3 %
Platelet: 173 x10 3/mm (ref 150–440)
RBC: 2.72 10*6/uL — ABNORMAL LOW (ref 3.80–5.20)
RDW: 20.2 % — ABNORMAL HIGH (ref 11.5–14.5)
WBC: 5.3 x10 3/mm (ref 3.6–11.0)

## 2014-09-09 ENCOUNTER — Encounter: Payer: Self-pay | Admitting: Internal Medicine

## 2014-09-09 DIAGNOSIS — R05 Cough: Secondary | ICD-10-CM | POA: Diagnosis not present

## 2014-09-09 DIAGNOSIS — R0609 Other forms of dyspnea: Secondary | ICD-10-CM | POA: Diagnosis not present

## 2014-09-09 DIAGNOSIS — C7951 Secondary malignant neoplasm of bone: Secondary | ICD-10-CM | POA: Diagnosis not present

## 2014-09-09 DIAGNOSIS — R971 Elevated cancer antigen 125 [CA 125]: Secondary | ICD-10-CM | POA: Diagnosis not present

## 2014-09-09 DIAGNOSIS — E785 Hyperlipidemia, unspecified: Secondary | ICD-10-CM | POA: Diagnosis not present

## 2014-09-09 DIAGNOSIS — C781 Secondary malignant neoplasm of mediastinum: Secondary | ICD-10-CM | POA: Diagnosis not present

## 2014-09-09 DIAGNOSIS — D72829 Elevated white blood cell count, unspecified: Secondary | ICD-10-CM | POA: Diagnosis not present

## 2014-09-09 DIAGNOSIS — K5790 Diverticulosis of intestine, part unspecified, without perforation or abscess without bleeding: Secondary | ICD-10-CM | POA: Diagnosis not present

## 2014-09-09 DIAGNOSIS — F17210 Nicotine dependence, cigarettes, uncomplicated: Secondary | ICD-10-CM | POA: Diagnosis not present

## 2014-09-09 DIAGNOSIS — Z85820 Personal history of malignant melanoma of skin: Secondary | ICD-10-CM | POA: Diagnosis not present

## 2014-09-09 DIAGNOSIS — D649 Anemia, unspecified: Secondary | ICD-10-CM | POA: Diagnosis not present

## 2014-09-09 DIAGNOSIS — C787 Secondary malignant neoplasm of liver and intrahepatic bile duct: Secondary | ICD-10-CM | POA: Diagnosis not present

## 2014-09-09 DIAGNOSIS — I7090 Unspecified atherosclerosis: Secondary | ICD-10-CM | POA: Diagnosis not present

## 2014-09-09 DIAGNOSIS — I10 Essential (primary) hypertension: Secondary | ICD-10-CM | POA: Diagnosis not present

## 2014-09-09 DIAGNOSIS — C3491 Malignant neoplasm of unspecified part of right bronchus or lung: Secondary | ICD-10-CM | POA: Diagnosis not present

## 2014-09-09 DIAGNOSIS — M810 Age-related osteoporosis without current pathological fracture: Secondary | ICD-10-CM | POA: Diagnosis not present

## 2014-09-09 DIAGNOSIS — F411 Generalized anxiety disorder: Secondary | ICD-10-CM | POA: Diagnosis not present

## 2014-09-09 DIAGNOSIS — M4856XS Collapsed vertebra, not elsewhere classified, lumbar region, sequela of fracture: Secondary | ICD-10-CM | POA: Diagnosis not present

## 2014-09-09 DIAGNOSIS — Z5111 Encounter for antineoplastic chemotherapy: Secondary | ICD-10-CM | POA: Diagnosis not present

## 2014-09-09 DIAGNOSIS — M545 Low back pain: Secondary | ICD-10-CM | POA: Diagnosis not present

## 2014-09-09 DIAGNOSIS — D6959 Other secondary thrombocytopenia: Secondary | ICD-10-CM | POA: Diagnosis not present

## 2014-09-09 DIAGNOSIS — D7389 Other diseases of spleen: Secondary | ICD-10-CM | POA: Diagnosis not present

## 2014-09-09 DIAGNOSIS — I77811 Abdominal aortic ectasia: Secondary | ICD-10-CM | POA: Diagnosis not present

## 2014-09-09 DIAGNOSIS — Z79899 Other long term (current) drug therapy: Secondary | ICD-10-CM | POA: Diagnosis not present

## 2014-09-09 DIAGNOSIS — R970 Elevated carcinoembryonic antigen [CEA]: Secondary | ICD-10-CM | POA: Diagnosis not present

## 2014-09-10 DIAGNOSIS — R05 Cough: Secondary | ICD-10-CM | POA: Diagnosis not present

## 2014-09-10 DIAGNOSIS — E785 Hyperlipidemia, unspecified: Secondary | ICD-10-CM | POA: Diagnosis not present

## 2014-09-10 DIAGNOSIS — I7090 Unspecified atherosclerosis: Secondary | ICD-10-CM | POA: Diagnosis not present

## 2014-09-10 DIAGNOSIS — D72829 Elevated white blood cell count, unspecified: Secondary | ICD-10-CM | POA: Diagnosis not present

## 2014-09-10 DIAGNOSIS — R970 Elevated carcinoembryonic antigen [CEA]: Secondary | ICD-10-CM | POA: Diagnosis not present

## 2014-09-10 DIAGNOSIS — C787 Secondary malignant neoplasm of liver and intrahepatic bile duct: Secondary | ICD-10-CM | POA: Diagnosis not present

## 2014-09-10 DIAGNOSIS — K5790 Diverticulosis of intestine, part unspecified, without perforation or abscess without bleeding: Secondary | ICD-10-CM | POA: Diagnosis not present

## 2014-09-10 DIAGNOSIS — F17210 Nicotine dependence, cigarettes, uncomplicated: Secondary | ICD-10-CM | POA: Diagnosis not present

## 2014-09-10 DIAGNOSIS — C7951 Secondary malignant neoplasm of bone: Secondary | ICD-10-CM | POA: Diagnosis not present

## 2014-09-10 DIAGNOSIS — Z5111 Encounter for antineoplastic chemotherapy: Secondary | ICD-10-CM | POA: Diagnosis not present

## 2014-09-10 DIAGNOSIS — C3491 Malignant neoplasm of unspecified part of right bronchus or lung: Secondary | ICD-10-CM | POA: Diagnosis not present

## 2014-09-10 DIAGNOSIS — D7389 Other diseases of spleen: Secondary | ICD-10-CM | POA: Diagnosis not present

## 2014-09-10 DIAGNOSIS — M810 Age-related osteoporosis without current pathological fracture: Secondary | ICD-10-CM | POA: Diagnosis not present

## 2014-09-10 DIAGNOSIS — D6959 Other secondary thrombocytopenia: Secondary | ICD-10-CM | POA: Diagnosis not present

## 2014-09-10 DIAGNOSIS — R0609 Other forms of dyspnea: Secondary | ICD-10-CM | POA: Diagnosis not present

## 2014-09-10 DIAGNOSIS — I10 Essential (primary) hypertension: Secondary | ICD-10-CM | POA: Diagnosis not present

## 2014-09-10 DIAGNOSIS — R971 Elevated cancer antigen 125 [CA 125]: Secondary | ICD-10-CM | POA: Diagnosis not present

## 2014-09-10 DIAGNOSIS — D649 Anemia, unspecified: Secondary | ICD-10-CM | POA: Diagnosis not present

## 2014-09-10 DIAGNOSIS — Z79899 Other long term (current) drug therapy: Secondary | ICD-10-CM | POA: Diagnosis not present

## 2014-09-10 DIAGNOSIS — M4856XS Collapsed vertebra, not elsewhere classified, lumbar region, sequela of fracture: Secondary | ICD-10-CM | POA: Diagnosis not present

## 2014-09-10 DIAGNOSIS — C781 Secondary malignant neoplasm of mediastinum: Secondary | ICD-10-CM | POA: Diagnosis not present

## 2014-09-10 DIAGNOSIS — Z85820 Personal history of malignant melanoma of skin: Secondary | ICD-10-CM | POA: Diagnosis not present

## 2014-09-10 DIAGNOSIS — I77811 Abdominal aortic ectasia: Secondary | ICD-10-CM | POA: Diagnosis not present

## 2014-09-10 DIAGNOSIS — F411 Generalized anxiety disorder: Secondary | ICD-10-CM | POA: Diagnosis not present

## 2014-09-10 DIAGNOSIS — M545 Low back pain: Secondary | ICD-10-CM | POA: Diagnosis not present

## 2014-09-11 DIAGNOSIS — R05 Cough: Secondary | ICD-10-CM | POA: Diagnosis not present

## 2014-09-11 DIAGNOSIS — R970 Elevated carcinoembryonic antigen [CEA]: Secondary | ICD-10-CM | POA: Diagnosis not present

## 2014-09-11 DIAGNOSIS — M4856XS Collapsed vertebra, not elsewhere classified, lumbar region, sequela of fracture: Secondary | ICD-10-CM | POA: Diagnosis not present

## 2014-09-11 DIAGNOSIS — Z5111 Encounter for antineoplastic chemotherapy: Secondary | ICD-10-CM | POA: Diagnosis not present

## 2014-09-11 DIAGNOSIS — F411 Generalized anxiety disorder: Secondary | ICD-10-CM | POA: Diagnosis not present

## 2014-09-11 DIAGNOSIS — Z79899 Other long term (current) drug therapy: Secondary | ICD-10-CM | POA: Diagnosis not present

## 2014-09-11 DIAGNOSIS — C781 Secondary malignant neoplasm of mediastinum: Secondary | ICD-10-CM | POA: Diagnosis not present

## 2014-09-11 DIAGNOSIS — M810 Age-related osteoporosis without current pathological fracture: Secondary | ICD-10-CM | POA: Diagnosis not present

## 2014-09-11 DIAGNOSIS — D6959 Other secondary thrombocytopenia: Secondary | ICD-10-CM | POA: Diagnosis not present

## 2014-09-11 DIAGNOSIS — C787 Secondary malignant neoplasm of liver and intrahepatic bile duct: Secondary | ICD-10-CM | POA: Diagnosis not present

## 2014-09-11 DIAGNOSIS — Z85820 Personal history of malignant melanoma of skin: Secondary | ICD-10-CM | POA: Diagnosis not present

## 2014-09-11 DIAGNOSIS — D649 Anemia, unspecified: Secondary | ICD-10-CM | POA: Diagnosis not present

## 2014-09-11 DIAGNOSIS — I7090 Unspecified atherosclerosis: Secondary | ICD-10-CM | POA: Diagnosis not present

## 2014-09-11 DIAGNOSIS — R0609 Other forms of dyspnea: Secondary | ICD-10-CM | POA: Diagnosis not present

## 2014-09-11 DIAGNOSIS — I10 Essential (primary) hypertension: Secondary | ICD-10-CM | POA: Diagnosis not present

## 2014-09-11 DIAGNOSIS — D72829 Elevated white blood cell count, unspecified: Secondary | ICD-10-CM | POA: Diagnosis not present

## 2014-09-11 DIAGNOSIS — K5790 Diverticulosis of intestine, part unspecified, without perforation or abscess without bleeding: Secondary | ICD-10-CM | POA: Diagnosis not present

## 2014-09-11 DIAGNOSIS — D7389 Other diseases of spleen: Secondary | ICD-10-CM | POA: Diagnosis not present

## 2014-09-11 DIAGNOSIS — C7951 Secondary malignant neoplasm of bone: Secondary | ICD-10-CM | POA: Diagnosis not present

## 2014-09-11 DIAGNOSIS — F17210 Nicotine dependence, cigarettes, uncomplicated: Secondary | ICD-10-CM | POA: Diagnosis not present

## 2014-09-11 DIAGNOSIS — R971 Elevated cancer antigen 125 [CA 125]: Secondary | ICD-10-CM | POA: Diagnosis not present

## 2014-09-11 DIAGNOSIS — I77811 Abdominal aortic ectasia: Secondary | ICD-10-CM | POA: Diagnosis not present

## 2014-09-11 DIAGNOSIS — E785 Hyperlipidemia, unspecified: Secondary | ICD-10-CM | POA: Diagnosis not present

## 2014-09-11 DIAGNOSIS — M545 Low back pain: Secondary | ICD-10-CM | POA: Diagnosis not present

## 2014-09-11 DIAGNOSIS — C3491 Malignant neoplasm of unspecified part of right bronchus or lung: Secondary | ICD-10-CM | POA: Diagnosis not present

## 2014-09-15 ENCOUNTER — Ambulatory Visit: Payer: Self-pay | Admitting: Internal Medicine

## 2014-09-15 DIAGNOSIS — D6481 Anemia due to antineoplastic chemotherapy: Secondary | ICD-10-CM | POA: Diagnosis not present

## 2014-09-15 DIAGNOSIS — C781 Secondary malignant neoplasm of mediastinum: Secondary | ICD-10-CM | POA: Diagnosis not present

## 2014-09-15 DIAGNOSIS — R112 Nausea with vomiting, unspecified: Secondary | ICD-10-CM | POA: Diagnosis not present

## 2014-09-15 DIAGNOSIS — C7951 Secondary malignant neoplasm of bone: Secondary | ICD-10-CM | POA: Diagnosis not present

## 2014-09-15 DIAGNOSIS — C3491 Malignant neoplasm of unspecified part of right bronchus or lung: Secondary | ICD-10-CM | POA: Diagnosis not present

## 2014-09-15 DIAGNOSIS — R05 Cough: Secondary | ICD-10-CM | POA: Diagnosis not present

## 2014-09-15 DIAGNOSIS — R970 Elevated carcinoembryonic antigen [CEA]: Secondary | ICD-10-CM | POA: Diagnosis not present

## 2014-09-15 DIAGNOSIS — D7389 Other diseases of spleen: Secondary | ICD-10-CM | POA: Diagnosis not present

## 2014-09-15 DIAGNOSIS — M545 Low back pain: Secondary | ICD-10-CM | POA: Diagnosis not present

## 2014-09-15 DIAGNOSIS — I77811 Abdominal aortic ectasia: Secondary | ICD-10-CM | POA: Diagnosis not present

## 2014-09-15 DIAGNOSIS — M4856XS Collapsed vertebra, not elsewhere classified, lumbar region, sequela of fracture: Secondary | ICD-10-CM | POA: Diagnosis not present

## 2014-09-15 DIAGNOSIS — Z418 Encounter for other procedures for purposes other than remedying health state: Secondary | ICD-10-CM | POA: Diagnosis not present

## 2014-09-15 DIAGNOSIS — I7090 Unspecified atherosclerosis: Secondary | ICD-10-CM | POA: Diagnosis not present

## 2014-09-15 DIAGNOSIS — C787 Secondary malignant neoplasm of liver and intrahepatic bile duct: Secondary | ICD-10-CM | POA: Diagnosis not present

## 2014-09-15 DIAGNOSIS — F17210 Nicotine dependence, cigarettes, uncomplicated: Secondary | ICD-10-CM | POA: Diagnosis not present

## 2014-09-15 DIAGNOSIS — Z79899 Other long term (current) drug therapy: Secondary | ICD-10-CM | POA: Diagnosis not present

## 2014-09-15 DIAGNOSIS — R971 Elevated cancer antigen 125 [CA 125]: Secondary | ICD-10-CM | POA: Diagnosis not present

## 2014-09-15 DIAGNOSIS — D72829 Elevated white blood cell count, unspecified: Secondary | ICD-10-CM | POA: Diagnosis not present

## 2014-09-15 DIAGNOSIS — R0609 Other forms of dyspnea: Secondary | ICD-10-CM | POA: Diagnosis not present

## 2014-09-15 DIAGNOSIS — Z5111 Encounter for antineoplastic chemotherapy: Secondary | ICD-10-CM | POA: Diagnosis not present

## 2014-09-15 DIAGNOSIS — T451X5S Adverse effect of antineoplastic and immunosuppressive drugs, sequela: Secondary | ICD-10-CM | POA: Diagnosis not present

## 2014-09-15 DIAGNOSIS — D6959 Other secondary thrombocytopenia: Secondary | ICD-10-CM | POA: Diagnosis not present

## 2014-09-15 DIAGNOSIS — R1310 Dysphagia, unspecified: Secondary | ICD-10-CM | POA: Diagnosis not present

## 2014-09-15 DIAGNOSIS — I10 Essential (primary) hypertension: Secondary | ICD-10-CM | POA: Diagnosis not present

## 2014-09-15 DIAGNOSIS — D709 Neutropenia, unspecified: Secondary | ICD-10-CM | POA: Diagnosis not present

## 2014-09-15 LAB — CBC CANCER CENTER
Basophil #: 0 x10 3/mm (ref 0.0–0.1)
Basophil %: 0.4 %
EOS ABS: 0.1 x10 3/mm (ref 0.0–0.7)
Eosinophil %: 0.7 %
HCT: 25.7 % — AB (ref 35.0–47.0)
HGB: 8.3 g/dL — ABNORMAL LOW (ref 12.0–16.0)
LYMPHS PCT: 10.6 %
Lymphocyte #: 1.4 x10 3/mm (ref 1.0–3.6)
MCH: 35.2 pg — ABNORMAL HIGH (ref 26.0–34.0)
MCHC: 32.5 g/dL (ref 32.0–36.0)
MCV: 108 fL — ABNORMAL HIGH (ref 80–100)
MONOS PCT: 2.8 %
Monocyte #: 0.4 x10 3/mm (ref 0.2–0.9)
NEUTROS ABS: 11.2 x10 3/mm — AB (ref 1.4–6.5)
Neutrophil %: 85.5 %
PLATELETS: 65 x10 3/mm — AB (ref 150–440)
RBC: 2.37 10*6/uL — ABNORMAL LOW (ref 3.80–5.20)
RDW: 17.2 % — AB (ref 11.5–14.5)
WBC: 13.1 x10 3/mm — AB (ref 3.6–11.0)

## 2014-09-15 LAB — BASIC METABOLIC PANEL
Anion Gap: 12 (ref 7–16)
BUN: 23 mg/dL — ABNORMAL HIGH (ref 7–18)
CHLORIDE: 101 mmol/L (ref 98–107)
Calcium, Total: 9.2 mg/dL (ref 8.5–10.1)
Co2: 28 mmol/L (ref 21–32)
Creatinine: 0.81 mg/dL (ref 0.60–1.30)
EGFR (African American): >60
EGFR (Non-African Amer.): >60
Glucose: 116 mg/dL — ABNORMAL HIGH (ref 65–99)
Osmolality: 286 (ref 275–301)
POTASSIUM: 4.4 mmol/L (ref 3.5–5.1)
Sodium: 141 mmol/L (ref 136–145)

## 2014-09-15 LAB — HEPATIC FUNCTION PANEL A (ARMC)
ALK PHOS: 157 U/L — AB (ref 46–116)
AST: 19 U/L (ref 15–37)
Albumin: 3.8 g/dL (ref 3.4–5.0)
Bilirubin, Direct: 0.2 mg/dL (ref 0.0–0.2)
Bilirubin,Total: 0.4 mg/dL (ref 0.2–1.0)
SGPT (ALT): 23 U/L (ref 14–63)
Total Protein: 7.4 g/dL (ref 6.4–8.2)

## 2014-09-19 ENCOUNTER — Ambulatory Visit: Payer: Self-pay | Admitting: Internal Medicine

## 2014-09-19 DIAGNOSIS — R590 Localized enlarged lymph nodes: Secondary | ICD-10-CM | POA: Diagnosis not present

## 2014-09-19 DIAGNOSIS — Z1231 Encounter for screening mammogram for malignant neoplasm of breast: Secondary | ICD-10-CM | POA: Diagnosis not present

## 2014-09-19 LAB — HM MAMMOGRAPHY: HM Mammogram: NEGATIVE

## 2014-09-22 ENCOUNTER — Telehealth: Payer: Self-pay | Admitting: Internal Medicine

## 2014-09-22 DIAGNOSIS — R05 Cough: Secondary | ICD-10-CM | POA: Diagnosis not present

## 2014-09-22 DIAGNOSIS — R971 Elevated cancer antigen 125 [CA 125]: Secondary | ICD-10-CM | POA: Diagnosis not present

## 2014-09-22 DIAGNOSIS — D6481 Anemia due to antineoplastic chemotherapy: Secondary | ICD-10-CM | POA: Diagnosis not present

## 2014-09-22 DIAGNOSIS — I10 Essential (primary) hypertension: Secondary | ICD-10-CM | POA: Diagnosis not present

## 2014-09-22 DIAGNOSIS — M545 Low back pain: Secondary | ICD-10-CM | POA: Diagnosis not present

## 2014-09-22 DIAGNOSIS — C3490 Malignant neoplasm of unspecified part of unspecified bronchus or lung: Secondary | ICD-10-CM | POA: Diagnosis not present

## 2014-09-22 DIAGNOSIS — I77811 Abdominal aortic ectasia: Secondary | ICD-10-CM | POA: Diagnosis not present

## 2014-09-22 DIAGNOSIS — M4856XS Collapsed vertebra, not elsewhere classified, lumbar region, sequela of fracture: Secondary | ICD-10-CM | POA: Diagnosis not present

## 2014-09-22 DIAGNOSIS — C771 Secondary and unspecified malignant neoplasm of intrathoracic lymph nodes: Secondary | ICD-10-CM | POA: Diagnosis not present

## 2014-09-22 DIAGNOSIS — I7090 Unspecified atherosclerosis: Secondary | ICD-10-CM | POA: Diagnosis not present

## 2014-09-22 DIAGNOSIS — R112 Nausea with vomiting, unspecified: Secondary | ICD-10-CM | POA: Diagnosis not present

## 2014-09-22 DIAGNOSIS — F17210 Nicotine dependence, cigarettes, uncomplicated: Secondary | ICD-10-CM | POA: Diagnosis not present

## 2014-09-22 DIAGNOSIS — C7951 Secondary malignant neoplasm of bone: Secondary | ICD-10-CM | POA: Diagnosis not present

## 2014-09-22 DIAGNOSIS — T451X5S Adverse effect of antineoplastic and immunosuppressive drugs, sequela: Secondary | ICD-10-CM | POA: Diagnosis not present

## 2014-09-22 DIAGNOSIS — D709 Neutropenia, unspecified: Secondary | ICD-10-CM | POA: Diagnosis not present

## 2014-09-22 DIAGNOSIS — R1310 Dysphagia, unspecified: Secondary | ICD-10-CM | POA: Diagnosis not present

## 2014-09-22 DIAGNOSIS — R970 Elevated carcinoembryonic antigen [CEA]: Secondary | ICD-10-CM | POA: Diagnosis not present

## 2014-09-22 DIAGNOSIS — Z5111 Encounter for antineoplastic chemotherapy: Secondary | ICD-10-CM | POA: Diagnosis not present

## 2014-09-22 DIAGNOSIS — D7389 Other diseases of spleen: Secondary | ICD-10-CM | POA: Diagnosis not present

## 2014-09-22 DIAGNOSIS — D72829 Elevated white blood cell count, unspecified: Secondary | ICD-10-CM | POA: Diagnosis not present

## 2014-09-22 DIAGNOSIS — R0609 Other forms of dyspnea: Secondary | ICD-10-CM | POA: Diagnosis not present

## 2014-09-22 DIAGNOSIS — C3491 Malignant neoplasm of unspecified part of right bronchus or lung: Secondary | ICD-10-CM | POA: Diagnosis not present

## 2014-09-22 DIAGNOSIS — Z418 Encounter for other procedures for purposes other than remedying health state: Secondary | ICD-10-CM | POA: Diagnosis not present

## 2014-09-22 DIAGNOSIS — C781 Secondary malignant neoplasm of mediastinum: Secondary | ICD-10-CM | POA: Diagnosis not present

## 2014-09-22 DIAGNOSIS — D6959 Other secondary thrombocytopenia: Secondary | ICD-10-CM | POA: Diagnosis not present

## 2014-09-22 DIAGNOSIS — C787 Secondary malignant neoplasm of liver and intrahepatic bile duct: Secondary | ICD-10-CM | POA: Diagnosis not present

## 2014-09-22 DIAGNOSIS — Z79899 Other long term (current) drug therapy: Secondary | ICD-10-CM | POA: Diagnosis not present

## 2014-09-22 MED ORDER — HYDROCODONE-ACETAMINOPHEN 10-325 MG PO TABS: 1.0000 | ORAL_TABLET | Freq: Four times a day (QID) | ORAL | Status: DC | PRN

## 2014-09-22 NOTE — Telephone Encounter (Signed)
Ok to refill,  printed rx  

## 2014-09-22 NOTE — Telephone Encounter (Signed)
Called patient for pick up and placed script at front desk.

## 2014-09-22 NOTE — Telephone Encounter (Signed)
Patient called for refill on Hydrocodone last fill was !09/30/13 ok to fill?

## 2014-09-25 DIAGNOSIS — D6481 Anemia due to antineoplastic chemotherapy: Secondary | ICD-10-CM | POA: Diagnosis not present

## 2014-09-25 DIAGNOSIS — R0609 Other forms of dyspnea: Secondary | ICD-10-CM | POA: Diagnosis not present

## 2014-09-25 DIAGNOSIS — D7389 Other diseases of spleen: Secondary | ICD-10-CM | POA: Diagnosis not present

## 2014-09-25 DIAGNOSIS — T451X5S Adverse effect of antineoplastic and immunosuppressive drugs, sequela: Secondary | ICD-10-CM | POA: Diagnosis not present

## 2014-09-25 DIAGNOSIS — R05 Cough: Secondary | ICD-10-CM | POA: Diagnosis not present

## 2014-09-25 DIAGNOSIS — C3491 Malignant neoplasm of unspecified part of right bronchus or lung: Secondary | ICD-10-CM | POA: Diagnosis not present

## 2014-09-25 DIAGNOSIS — Z5111 Encounter for antineoplastic chemotherapy: Secondary | ICD-10-CM | POA: Diagnosis not present

## 2014-09-25 DIAGNOSIS — R112 Nausea with vomiting, unspecified: Secondary | ICD-10-CM | POA: Diagnosis not present

## 2014-09-25 DIAGNOSIS — R971 Elevated cancer antigen 125 [CA 125]: Secondary | ICD-10-CM | POA: Diagnosis not present

## 2014-09-25 DIAGNOSIS — R1310 Dysphagia, unspecified: Secondary | ICD-10-CM | POA: Diagnosis not present

## 2014-09-25 DIAGNOSIS — C7951 Secondary malignant neoplasm of bone: Secondary | ICD-10-CM | POA: Diagnosis not present

## 2014-09-25 DIAGNOSIS — D709 Neutropenia, unspecified: Secondary | ICD-10-CM | POA: Diagnosis not present

## 2014-09-25 DIAGNOSIS — C781 Secondary malignant neoplasm of mediastinum: Secondary | ICD-10-CM | POA: Diagnosis not present

## 2014-09-25 DIAGNOSIS — F17210 Nicotine dependence, cigarettes, uncomplicated: Secondary | ICD-10-CM | POA: Diagnosis not present

## 2014-09-25 DIAGNOSIS — M4856XS Collapsed vertebra, not elsewhere classified, lumbar region, sequela of fracture: Secondary | ICD-10-CM | POA: Diagnosis not present

## 2014-09-25 DIAGNOSIS — Z418 Encounter for other procedures for purposes other than remedying health state: Secondary | ICD-10-CM | POA: Diagnosis not present

## 2014-09-25 DIAGNOSIS — D6959 Other secondary thrombocytopenia: Secondary | ICD-10-CM | POA: Diagnosis not present

## 2014-09-25 DIAGNOSIS — Z79899 Other long term (current) drug therapy: Secondary | ICD-10-CM | POA: Diagnosis not present

## 2014-09-25 DIAGNOSIS — I7090 Unspecified atherosclerosis: Secondary | ICD-10-CM | POA: Diagnosis not present

## 2014-09-25 DIAGNOSIS — C787 Secondary malignant neoplasm of liver and intrahepatic bile duct: Secondary | ICD-10-CM | POA: Diagnosis not present

## 2014-09-25 DIAGNOSIS — D72829 Elevated white blood cell count, unspecified: Secondary | ICD-10-CM | POA: Diagnosis not present

## 2014-09-25 DIAGNOSIS — M545 Low back pain: Secondary | ICD-10-CM | POA: Diagnosis not present

## 2014-09-25 DIAGNOSIS — I77811 Abdominal aortic ectasia: Secondary | ICD-10-CM | POA: Diagnosis not present

## 2014-09-25 DIAGNOSIS — I10 Essential (primary) hypertension: Secondary | ICD-10-CM | POA: Diagnosis not present

## 2014-09-25 DIAGNOSIS — R970 Elevated carcinoembryonic antigen [CEA]: Secondary | ICD-10-CM | POA: Diagnosis not present

## 2014-09-29 DIAGNOSIS — Z79899 Other long term (current) drug therapy: Secondary | ICD-10-CM | POA: Diagnosis not present

## 2014-09-29 DIAGNOSIS — D7389 Other diseases of spleen: Secondary | ICD-10-CM | POA: Diagnosis not present

## 2014-09-29 DIAGNOSIS — R1310 Dysphagia, unspecified: Secondary | ICD-10-CM | POA: Diagnosis not present

## 2014-09-29 DIAGNOSIS — I10 Essential (primary) hypertension: Secondary | ICD-10-CM | POA: Diagnosis not present

## 2014-09-29 DIAGNOSIS — R112 Nausea with vomiting, unspecified: Secondary | ICD-10-CM | POA: Diagnosis not present

## 2014-09-29 DIAGNOSIS — F17210 Nicotine dependence, cigarettes, uncomplicated: Secondary | ICD-10-CM | POA: Diagnosis not present

## 2014-09-29 DIAGNOSIS — D709 Neutropenia, unspecified: Secondary | ICD-10-CM | POA: Diagnosis not present

## 2014-09-29 DIAGNOSIS — R971 Elevated cancer antigen 125 [CA 125]: Secondary | ICD-10-CM | POA: Diagnosis not present

## 2014-09-29 DIAGNOSIS — C7951 Secondary malignant neoplasm of bone: Secondary | ICD-10-CM | POA: Diagnosis not present

## 2014-09-29 DIAGNOSIS — C781 Secondary malignant neoplasm of mediastinum: Secondary | ICD-10-CM | POA: Diagnosis not present

## 2014-09-29 DIAGNOSIS — R970 Elevated carcinoembryonic antigen [CEA]: Secondary | ICD-10-CM | POA: Diagnosis not present

## 2014-09-29 DIAGNOSIS — C787 Secondary malignant neoplasm of liver and intrahepatic bile duct: Secondary | ICD-10-CM | POA: Diagnosis not present

## 2014-09-29 DIAGNOSIS — D72829 Elevated white blood cell count, unspecified: Secondary | ICD-10-CM | POA: Diagnosis not present

## 2014-09-29 DIAGNOSIS — R05 Cough: Secondary | ICD-10-CM | POA: Diagnosis not present

## 2014-09-29 DIAGNOSIS — D6959 Other secondary thrombocytopenia: Secondary | ICD-10-CM | POA: Diagnosis not present

## 2014-09-29 DIAGNOSIS — I77811 Abdominal aortic ectasia: Secondary | ICD-10-CM | POA: Diagnosis not present

## 2014-09-29 DIAGNOSIS — M4856XS Collapsed vertebra, not elsewhere classified, lumbar region, sequela of fracture: Secondary | ICD-10-CM | POA: Diagnosis not present

## 2014-09-29 DIAGNOSIS — R0609 Other forms of dyspnea: Secondary | ICD-10-CM | POA: Diagnosis not present

## 2014-09-29 DIAGNOSIS — Z418 Encounter for other procedures for purposes other than remedying health state: Secondary | ICD-10-CM | POA: Diagnosis not present

## 2014-09-29 DIAGNOSIS — T451X5S Adverse effect of antineoplastic and immunosuppressive drugs, sequela: Secondary | ICD-10-CM | POA: Diagnosis not present

## 2014-09-29 DIAGNOSIS — M545 Low back pain: Secondary | ICD-10-CM | POA: Diagnosis not present

## 2014-09-29 DIAGNOSIS — D6481 Anemia due to antineoplastic chemotherapy: Secondary | ICD-10-CM | POA: Diagnosis not present

## 2014-09-29 DIAGNOSIS — I7090 Unspecified atherosclerosis: Secondary | ICD-10-CM | POA: Diagnosis not present

## 2014-09-29 DIAGNOSIS — C3491 Malignant neoplasm of unspecified part of right bronchus or lung: Secondary | ICD-10-CM | POA: Diagnosis not present

## 2014-09-29 DIAGNOSIS — Z5111 Encounter for antineoplastic chemotherapy: Secondary | ICD-10-CM | POA: Diagnosis not present

## 2014-09-30 DIAGNOSIS — R1310 Dysphagia, unspecified: Secondary | ICD-10-CM | POA: Diagnosis not present

## 2014-09-30 DIAGNOSIS — T451X5S Adverse effect of antineoplastic and immunosuppressive drugs, sequela: Secondary | ICD-10-CM | POA: Diagnosis not present

## 2014-09-30 DIAGNOSIS — R112 Nausea with vomiting, unspecified: Secondary | ICD-10-CM | POA: Diagnosis not present

## 2014-09-30 DIAGNOSIS — D6959 Other secondary thrombocytopenia: Secondary | ICD-10-CM | POA: Diagnosis not present

## 2014-09-30 DIAGNOSIS — Z418 Encounter for other procedures for purposes other than remedying health state: Secondary | ICD-10-CM | POA: Diagnosis not present

## 2014-09-30 DIAGNOSIS — I7090 Unspecified atherosclerosis: Secondary | ICD-10-CM | POA: Diagnosis not present

## 2014-09-30 DIAGNOSIS — Z79899 Other long term (current) drug therapy: Secondary | ICD-10-CM | POA: Diagnosis not present

## 2014-09-30 DIAGNOSIS — Z5111 Encounter for antineoplastic chemotherapy: Secondary | ICD-10-CM | POA: Diagnosis not present

## 2014-09-30 DIAGNOSIS — R971 Elevated cancer antigen 125 [CA 125]: Secondary | ICD-10-CM | POA: Diagnosis not present

## 2014-09-30 DIAGNOSIS — M4856XS Collapsed vertebra, not elsewhere classified, lumbar region, sequela of fracture: Secondary | ICD-10-CM | POA: Diagnosis not present

## 2014-09-30 DIAGNOSIS — I77811 Abdominal aortic ectasia: Secondary | ICD-10-CM | POA: Diagnosis not present

## 2014-09-30 DIAGNOSIS — I10 Essential (primary) hypertension: Secondary | ICD-10-CM | POA: Diagnosis not present

## 2014-09-30 DIAGNOSIS — C781 Secondary malignant neoplasm of mediastinum: Secondary | ICD-10-CM | POA: Diagnosis not present

## 2014-09-30 DIAGNOSIS — C3491 Malignant neoplasm of unspecified part of right bronchus or lung: Secondary | ICD-10-CM | POA: Diagnosis not present

## 2014-09-30 DIAGNOSIS — F17210 Nicotine dependence, cigarettes, uncomplicated: Secondary | ICD-10-CM | POA: Diagnosis not present

## 2014-09-30 DIAGNOSIS — R05 Cough: Secondary | ICD-10-CM | POA: Diagnosis not present

## 2014-09-30 DIAGNOSIS — R970 Elevated carcinoembryonic antigen [CEA]: Secondary | ICD-10-CM | POA: Diagnosis not present

## 2014-09-30 DIAGNOSIS — M545 Low back pain: Secondary | ICD-10-CM | POA: Diagnosis not present

## 2014-09-30 DIAGNOSIS — D72829 Elevated white blood cell count, unspecified: Secondary | ICD-10-CM | POA: Diagnosis not present

## 2014-09-30 DIAGNOSIS — R0609 Other forms of dyspnea: Secondary | ICD-10-CM | POA: Diagnosis not present

## 2014-09-30 DIAGNOSIS — D709 Neutropenia, unspecified: Secondary | ICD-10-CM | POA: Diagnosis not present

## 2014-09-30 DIAGNOSIS — C787 Secondary malignant neoplasm of liver and intrahepatic bile duct: Secondary | ICD-10-CM | POA: Diagnosis not present

## 2014-09-30 DIAGNOSIS — D7389 Other diseases of spleen: Secondary | ICD-10-CM | POA: Diagnosis not present

## 2014-09-30 DIAGNOSIS — D6481 Anemia due to antineoplastic chemotherapy: Secondary | ICD-10-CM | POA: Diagnosis not present

## 2014-09-30 DIAGNOSIS — C7951 Secondary malignant neoplasm of bone: Secondary | ICD-10-CM | POA: Diagnosis not present

## 2014-10-01 DIAGNOSIS — R0609 Other forms of dyspnea: Secondary | ICD-10-CM | POA: Diagnosis not present

## 2014-10-01 DIAGNOSIS — C3491 Malignant neoplasm of unspecified part of right bronchus or lung: Secondary | ICD-10-CM | POA: Diagnosis not present

## 2014-10-01 DIAGNOSIS — R971 Elevated cancer antigen 125 [CA 125]: Secondary | ICD-10-CM | POA: Diagnosis not present

## 2014-10-01 DIAGNOSIS — C787 Secondary malignant neoplasm of liver and intrahepatic bile duct: Secondary | ICD-10-CM | POA: Diagnosis not present

## 2014-10-01 DIAGNOSIS — M545 Low back pain: Secondary | ICD-10-CM | POA: Diagnosis not present

## 2014-10-01 DIAGNOSIS — D6481 Anemia due to antineoplastic chemotherapy: Secondary | ICD-10-CM | POA: Diagnosis not present

## 2014-10-01 DIAGNOSIS — D72829 Elevated white blood cell count, unspecified: Secondary | ICD-10-CM | POA: Diagnosis not present

## 2014-10-01 DIAGNOSIS — D6959 Other secondary thrombocytopenia: Secondary | ICD-10-CM | POA: Diagnosis not present

## 2014-10-01 DIAGNOSIS — M4856XS Collapsed vertebra, not elsewhere classified, lumbar region, sequela of fracture: Secondary | ICD-10-CM | POA: Diagnosis not present

## 2014-10-01 DIAGNOSIS — I7090 Unspecified atherosclerosis: Secondary | ICD-10-CM | POA: Diagnosis not present

## 2014-10-01 DIAGNOSIS — C7951 Secondary malignant neoplasm of bone: Secondary | ICD-10-CM | POA: Diagnosis not present

## 2014-10-01 DIAGNOSIS — Z418 Encounter for other procedures for purposes other than remedying health state: Secondary | ICD-10-CM | POA: Diagnosis not present

## 2014-10-01 DIAGNOSIS — D709 Neutropenia, unspecified: Secondary | ICD-10-CM | POA: Diagnosis not present

## 2014-10-01 DIAGNOSIS — C781 Secondary malignant neoplasm of mediastinum: Secondary | ICD-10-CM | POA: Diagnosis not present

## 2014-10-01 DIAGNOSIS — Z79899 Other long term (current) drug therapy: Secondary | ICD-10-CM | POA: Diagnosis not present

## 2014-10-01 DIAGNOSIS — D7389 Other diseases of spleen: Secondary | ICD-10-CM | POA: Diagnosis not present

## 2014-10-01 DIAGNOSIS — Z5111 Encounter for antineoplastic chemotherapy: Secondary | ICD-10-CM | POA: Diagnosis not present

## 2014-10-01 DIAGNOSIS — T451X5S Adverse effect of antineoplastic and immunosuppressive drugs, sequela: Secondary | ICD-10-CM | POA: Diagnosis not present

## 2014-10-01 DIAGNOSIS — R112 Nausea with vomiting, unspecified: Secondary | ICD-10-CM | POA: Diagnosis not present

## 2014-10-01 DIAGNOSIS — R1310 Dysphagia, unspecified: Secondary | ICD-10-CM | POA: Diagnosis not present

## 2014-10-01 DIAGNOSIS — R05 Cough: Secondary | ICD-10-CM | POA: Diagnosis not present

## 2014-10-01 DIAGNOSIS — I10 Essential (primary) hypertension: Secondary | ICD-10-CM | POA: Diagnosis not present

## 2014-10-01 DIAGNOSIS — I77811 Abdominal aortic ectasia: Secondary | ICD-10-CM | POA: Diagnosis not present

## 2014-10-01 DIAGNOSIS — R970 Elevated carcinoembryonic antigen [CEA]: Secondary | ICD-10-CM | POA: Diagnosis not present

## 2014-10-01 DIAGNOSIS — F17210 Nicotine dependence, cigarettes, uncomplicated: Secondary | ICD-10-CM | POA: Diagnosis not present

## 2014-10-02 DIAGNOSIS — R112 Nausea with vomiting, unspecified: Secondary | ICD-10-CM | POA: Diagnosis not present

## 2014-10-02 DIAGNOSIS — Z5111 Encounter for antineoplastic chemotherapy: Secondary | ICD-10-CM | POA: Diagnosis not present

## 2014-10-02 DIAGNOSIS — T451X5S Adverse effect of antineoplastic and immunosuppressive drugs, sequela: Secondary | ICD-10-CM | POA: Diagnosis not present

## 2014-10-02 DIAGNOSIS — C3491 Malignant neoplasm of unspecified part of right bronchus or lung: Secondary | ICD-10-CM | POA: Diagnosis not present

## 2014-10-02 DIAGNOSIS — D709 Neutropenia, unspecified: Secondary | ICD-10-CM | POA: Diagnosis not present

## 2014-10-02 DIAGNOSIS — R1310 Dysphagia, unspecified: Secondary | ICD-10-CM | POA: Diagnosis not present

## 2014-10-02 DIAGNOSIS — D72829 Elevated white blood cell count, unspecified: Secondary | ICD-10-CM | POA: Diagnosis not present

## 2014-10-02 DIAGNOSIS — D6481 Anemia due to antineoplastic chemotherapy: Secondary | ICD-10-CM | POA: Diagnosis not present

## 2014-10-02 DIAGNOSIS — Z79899 Other long term (current) drug therapy: Secondary | ICD-10-CM | POA: Diagnosis not present

## 2014-10-02 DIAGNOSIS — I7090 Unspecified atherosclerosis: Secondary | ICD-10-CM | POA: Diagnosis not present

## 2014-10-02 DIAGNOSIS — R970 Elevated carcinoembryonic antigen [CEA]: Secondary | ICD-10-CM | POA: Diagnosis not present

## 2014-10-02 DIAGNOSIS — D6959 Other secondary thrombocytopenia: Secondary | ICD-10-CM | POA: Diagnosis not present

## 2014-10-02 DIAGNOSIS — M545 Low back pain: Secondary | ICD-10-CM | POA: Diagnosis not present

## 2014-10-02 DIAGNOSIS — R05 Cough: Secondary | ICD-10-CM | POA: Diagnosis not present

## 2014-10-02 DIAGNOSIS — D7389 Other diseases of spleen: Secondary | ICD-10-CM | POA: Diagnosis not present

## 2014-10-02 DIAGNOSIS — Z418 Encounter for other procedures for purposes other than remedying health state: Secondary | ICD-10-CM | POA: Diagnosis not present

## 2014-10-02 DIAGNOSIS — R0609 Other forms of dyspnea: Secondary | ICD-10-CM | POA: Diagnosis not present

## 2014-10-02 DIAGNOSIS — I77811 Abdominal aortic ectasia: Secondary | ICD-10-CM | POA: Diagnosis not present

## 2014-10-02 DIAGNOSIS — F17210 Nicotine dependence, cigarettes, uncomplicated: Secondary | ICD-10-CM | POA: Diagnosis not present

## 2014-10-02 DIAGNOSIS — C7951 Secondary malignant neoplasm of bone: Secondary | ICD-10-CM | POA: Diagnosis not present

## 2014-10-02 DIAGNOSIS — C787 Secondary malignant neoplasm of liver and intrahepatic bile duct: Secondary | ICD-10-CM | POA: Diagnosis not present

## 2014-10-02 DIAGNOSIS — R971 Elevated cancer antigen 125 [CA 125]: Secondary | ICD-10-CM | POA: Diagnosis not present

## 2014-10-02 DIAGNOSIS — I10 Essential (primary) hypertension: Secondary | ICD-10-CM | POA: Diagnosis not present

## 2014-10-02 DIAGNOSIS — M4856XS Collapsed vertebra, not elsewhere classified, lumbar region, sequela of fracture: Secondary | ICD-10-CM | POA: Diagnosis not present

## 2014-10-02 DIAGNOSIS — C781 Secondary malignant neoplasm of mediastinum: Secondary | ICD-10-CM | POA: Diagnosis not present

## 2014-10-07 DIAGNOSIS — D709 Neutropenia, unspecified: Secondary | ICD-10-CM | POA: Diagnosis not present

## 2014-10-07 DIAGNOSIS — D6959 Other secondary thrombocytopenia: Secondary | ICD-10-CM | POA: Diagnosis not present

## 2014-10-07 DIAGNOSIS — I7090 Unspecified atherosclerosis: Secondary | ICD-10-CM | POA: Diagnosis not present

## 2014-10-07 DIAGNOSIS — Z79899 Other long term (current) drug therapy: Secondary | ICD-10-CM | POA: Diagnosis not present

## 2014-10-07 DIAGNOSIS — C7951 Secondary malignant neoplasm of bone: Secondary | ICD-10-CM | POA: Diagnosis not present

## 2014-10-07 DIAGNOSIS — Z418 Encounter for other procedures for purposes other than remedying health state: Secondary | ICD-10-CM | POA: Diagnosis not present

## 2014-10-07 DIAGNOSIS — T451X5S Adverse effect of antineoplastic and immunosuppressive drugs, sequela: Secondary | ICD-10-CM | POA: Diagnosis not present

## 2014-10-07 DIAGNOSIS — C787 Secondary malignant neoplasm of liver and intrahepatic bile duct: Secondary | ICD-10-CM | POA: Diagnosis not present

## 2014-10-07 DIAGNOSIS — C3491 Malignant neoplasm of unspecified part of right bronchus or lung: Secondary | ICD-10-CM | POA: Diagnosis not present

## 2014-10-07 DIAGNOSIS — R971 Elevated cancer antigen 125 [CA 125]: Secondary | ICD-10-CM | POA: Diagnosis not present

## 2014-10-07 DIAGNOSIS — Z5111 Encounter for antineoplastic chemotherapy: Secondary | ICD-10-CM | POA: Diagnosis not present

## 2014-10-07 DIAGNOSIS — D72829 Elevated white blood cell count, unspecified: Secondary | ICD-10-CM | POA: Diagnosis not present

## 2014-10-07 DIAGNOSIS — D6481 Anemia due to antineoplastic chemotherapy: Secondary | ICD-10-CM | POA: Diagnosis not present

## 2014-10-07 DIAGNOSIS — R1310 Dysphagia, unspecified: Secondary | ICD-10-CM | POA: Diagnosis not present

## 2014-10-07 DIAGNOSIS — R0609 Other forms of dyspnea: Secondary | ICD-10-CM | POA: Diagnosis not present

## 2014-10-07 DIAGNOSIS — R970 Elevated carcinoembryonic antigen [CEA]: Secondary | ICD-10-CM | POA: Diagnosis not present

## 2014-10-07 DIAGNOSIS — R05 Cough: Secondary | ICD-10-CM | POA: Diagnosis not present

## 2014-10-07 DIAGNOSIS — R112 Nausea with vomiting, unspecified: Secondary | ICD-10-CM | POA: Diagnosis not present

## 2014-10-07 DIAGNOSIS — M4856XS Collapsed vertebra, not elsewhere classified, lumbar region, sequela of fracture: Secondary | ICD-10-CM | POA: Diagnosis not present

## 2014-10-07 DIAGNOSIS — I10 Essential (primary) hypertension: Secondary | ICD-10-CM | POA: Diagnosis not present

## 2014-10-07 DIAGNOSIS — C781 Secondary malignant neoplasm of mediastinum: Secondary | ICD-10-CM | POA: Diagnosis not present

## 2014-10-07 DIAGNOSIS — M545 Low back pain: Secondary | ICD-10-CM | POA: Diagnosis not present

## 2014-10-07 DIAGNOSIS — D7389 Other diseases of spleen: Secondary | ICD-10-CM | POA: Diagnosis not present

## 2014-10-07 DIAGNOSIS — F17210 Nicotine dependence, cigarettes, uncomplicated: Secondary | ICD-10-CM | POA: Diagnosis not present

## 2014-10-07 DIAGNOSIS — I77811 Abdominal aortic ectasia: Secondary | ICD-10-CM | POA: Diagnosis not present

## 2014-10-14 ENCOUNTER — Ambulatory Visit
Admit: 2014-10-14 | Disposition: A | Discharge status: Home / Self Care | Payer: Self-pay | Attending: Internal Medicine | Admitting: Internal Medicine

## 2014-10-16 DIAGNOSIS — C781 Secondary malignant neoplasm of mediastinum: Secondary | ICD-10-CM | POA: Diagnosis not present

## 2014-10-16 DIAGNOSIS — Z79899 Other long term (current) drug therapy: Secondary | ICD-10-CM | POA: Diagnosis not present

## 2014-10-16 DIAGNOSIS — D6481 Anemia due to antineoplastic chemotherapy: Secondary | ICD-10-CM | POA: Diagnosis not present

## 2014-10-16 DIAGNOSIS — D7389 Other diseases of spleen: Secondary | ICD-10-CM | POA: Diagnosis not present

## 2014-10-16 DIAGNOSIS — I10 Essential (primary) hypertension: Secondary | ICD-10-CM | POA: Diagnosis not present

## 2014-10-16 DIAGNOSIS — I77811 Abdominal aortic ectasia: Secondary | ICD-10-CM | POA: Diagnosis not present

## 2014-10-16 DIAGNOSIS — R0609 Other forms of dyspnea: Secondary | ICD-10-CM | POA: Diagnosis not present

## 2014-10-16 DIAGNOSIS — Z418 Encounter for other procedures for purposes other than remedying health state: Secondary | ICD-10-CM | POA: Diagnosis not present

## 2014-10-16 DIAGNOSIS — D709 Neutropenia, unspecified: Secondary | ICD-10-CM | POA: Diagnosis not present

## 2014-10-16 DIAGNOSIS — R05 Cough: Secondary | ICD-10-CM | POA: Diagnosis not present

## 2014-10-16 DIAGNOSIS — I7090 Unspecified atherosclerosis: Secondary | ICD-10-CM | POA: Diagnosis not present

## 2014-10-16 DIAGNOSIS — F17210 Nicotine dependence, cigarettes, uncomplicated: Secondary | ICD-10-CM | POA: Diagnosis not present

## 2014-10-16 DIAGNOSIS — M4856XS Collapsed vertebra, not elsewhere classified, lumbar region, sequela of fracture: Secondary | ICD-10-CM | POA: Diagnosis not present

## 2014-10-16 DIAGNOSIS — T451X5S Adverse effect of antineoplastic and immunosuppressive drugs, sequela: Secondary | ICD-10-CM | POA: Diagnosis not present

## 2014-10-16 DIAGNOSIS — Z5111 Encounter for antineoplastic chemotherapy: Secondary | ICD-10-CM | POA: Diagnosis not present

## 2014-10-16 DIAGNOSIS — D72829 Elevated white blood cell count, unspecified: Secondary | ICD-10-CM | POA: Diagnosis not present

## 2014-10-16 DIAGNOSIS — M545 Low back pain: Secondary | ICD-10-CM | POA: Diagnosis not present

## 2014-10-16 DIAGNOSIS — R971 Elevated cancer antigen 125 [CA 125]: Secondary | ICD-10-CM | POA: Diagnosis not present

## 2014-10-16 DIAGNOSIS — R970 Elevated carcinoembryonic antigen [CEA]: Secondary | ICD-10-CM | POA: Diagnosis not present

## 2014-10-16 DIAGNOSIS — C3491 Malignant neoplasm of unspecified part of right bronchus or lung: Secondary | ICD-10-CM | POA: Diagnosis not present

## 2014-10-16 DIAGNOSIS — R1310 Dysphagia, unspecified: Secondary | ICD-10-CM | POA: Diagnosis not present

## 2014-10-16 DIAGNOSIS — C7951 Secondary malignant neoplasm of bone: Secondary | ICD-10-CM | POA: Diagnosis not present

## 2014-10-16 DIAGNOSIS — C787 Secondary malignant neoplasm of liver and intrahepatic bile duct: Secondary | ICD-10-CM | POA: Diagnosis not present

## 2014-10-16 DIAGNOSIS — R112 Nausea with vomiting, unspecified: Secondary | ICD-10-CM | POA: Diagnosis not present

## 2014-10-16 DIAGNOSIS — D6959 Other secondary thrombocytopenia: Secondary | ICD-10-CM | POA: Diagnosis not present

## 2014-10-27 DIAGNOSIS — M4856XS Collapsed vertebra, not elsewhere classified, lumbar region, sequela of fracture: Secondary | ICD-10-CM | POA: Diagnosis not present

## 2014-10-27 DIAGNOSIS — Z5111 Encounter for antineoplastic chemotherapy: Secondary | ICD-10-CM | POA: Diagnosis not present

## 2014-10-27 DIAGNOSIS — R970 Elevated carcinoembryonic antigen [CEA]: Secondary | ICD-10-CM | POA: Diagnosis not present

## 2014-10-27 DIAGNOSIS — I77811 Abdominal aortic ectasia: Secondary | ICD-10-CM | POA: Diagnosis not present

## 2014-10-27 DIAGNOSIS — I7090 Unspecified atherosclerosis: Secondary | ICD-10-CM | POA: Diagnosis not present

## 2014-10-27 DIAGNOSIS — F17210 Nicotine dependence, cigarettes, uncomplicated: Secondary | ICD-10-CM | POA: Diagnosis not present

## 2014-10-27 DIAGNOSIS — C787 Secondary malignant neoplasm of liver and intrahepatic bile duct: Secondary | ICD-10-CM | POA: Diagnosis not present

## 2014-10-27 DIAGNOSIS — T451X5S Adverse effect of antineoplastic and immunosuppressive drugs, sequela: Secondary | ICD-10-CM | POA: Diagnosis not present

## 2014-10-27 DIAGNOSIS — C3491 Malignant neoplasm of unspecified part of right bronchus or lung: Secondary | ICD-10-CM | POA: Diagnosis not present

## 2014-10-27 DIAGNOSIS — Z418 Encounter for other procedures for purposes other than remedying health state: Secondary | ICD-10-CM | POA: Diagnosis not present

## 2014-10-27 DIAGNOSIS — R05 Cough: Secondary | ICD-10-CM | POA: Diagnosis not present

## 2014-10-27 DIAGNOSIS — D72829 Elevated white blood cell count, unspecified: Secondary | ICD-10-CM | POA: Diagnosis not present

## 2014-10-27 DIAGNOSIS — R0609 Other forms of dyspnea: Secondary | ICD-10-CM | POA: Diagnosis not present

## 2014-10-27 DIAGNOSIS — R1310 Dysphagia, unspecified: Secondary | ICD-10-CM | POA: Diagnosis not present

## 2014-10-27 DIAGNOSIS — I10 Essential (primary) hypertension: Secondary | ICD-10-CM | POA: Diagnosis not present

## 2014-10-27 DIAGNOSIS — C7951 Secondary malignant neoplasm of bone: Secondary | ICD-10-CM | POA: Diagnosis not present

## 2014-10-27 DIAGNOSIS — M545 Low back pain: Secondary | ICD-10-CM | POA: Diagnosis not present

## 2014-10-27 DIAGNOSIS — C781 Secondary malignant neoplasm of mediastinum: Secondary | ICD-10-CM | POA: Diagnosis not present

## 2014-10-27 DIAGNOSIS — D7389 Other diseases of spleen: Secondary | ICD-10-CM | POA: Diagnosis not present

## 2014-10-27 DIAGNOSIS — D6481 Anemia due to antineoplastic chemotherapy: Secondary | ICD-10-CM | POA: Diagnosis not present

## 2014-10-27 DIAGNOSIS — Z79899 Other long term (current) drug therapy: Secondary | ICD-10-CM | POA: Diagnosis not present

## 2014-10-27 DIAGNOSIS — R971 Elevated cancer antigen 125 [CA 125]: Secondary | ICD-10-CM | POA: Diagnosis not present

## 2014-10-27 DIAGNOSIS — R112 Nausea with vomiting, unspecified: Secondary | ICD-10-CM | POA: Diagnosis not present

## 2014-10-27 DIAGNOSIS — D709 Neutropenia, unspecified: Secondary | ICD-10-CM | POA: Diagnosis not present

## 2014-10-27 DIAGNOSIS — D6959 Other secondary thrombocytopenia: Secondary | ICD-10-CM | POA: Diagnosis not present

## 2014-10-28 DIAGNOSIS — M4856XS Collapsed vertebra, not elsewhere classified, lumbar region, sequela of fracture: Secondary | ICD-10-CM | POA: Diagnosis not present

## 2014-10-28 DIAGNOSIS — D6481 Anemia due to antineoplastic chemotherapy: Secondary | ICD-10-CM | POA: Diagnosis not present

## 2014-10-28 DIAGNOSIS — C781 Secondary malignant neoplasm of mediastinum: Secondary | ICD-10-CM | POA: Diagnosis not present

## 2014-10-28 DIAGNOSIS — I7090 Unspecified atherosclerosis: Secondary | ICD-10-CM | POA: Diagnosis not present

## 2014-10-28 DIAGNOSIS — F17210 Nicotine dependence, cigarettes, uncomplicated: Secondary | ICD-10-CM | POA: Diagnosis not present

## 2014-10-28 DIAGNOSIS — I77811 Abdominal aortic ectasia: Secondary | ICD-10-CM | POA: Diagnosis not present

## 2014-10-28 DIAGNOSIS — Z5111 Encounter for antineoplastic chemotherapy: Secondary | ICD-10-CM | POA: Diagnosis not present

## 2014-10-28 DIAGNOSIS — C3491 Malignant neoplasm of unspecified part of right bronchus or lung: Secondary | ICD-10-CM | POA: Diagnosis not present

## 2014-10-28 DIAGNOSIS — D7389 Other diseases of spleen: Secondary | ICD-10-CM | POA: Diagnosis not present

## 2014-10-28 DIAGNOSIS — D709 Neutropenia, unspecified: Secondary | ICD-10-CM | POA: Diagnosis not present

## 2014-10-28 DIAGNOSIS — Z79899 Other long term (current) drug therapy: Secondary | ICD-10-CM | POA: Diagnosis not present

## 2014-10-28 DIAGNOSIS — R971 Elevated cancer antigen 125 [CA 125]: Secondary | ICD-10-CM | POA: Diagnosis not present

## 2014-10-28 DIAGNOSIS — D6959 Other secondary thrombocytopenia: Secondary | ICD-10-CM | POA: Diagnosis not present

## 2014-10-28 DIAGNOSIS — R970 Elevated carcinoembryonic antigen [CEA]: Secondary | ICD-10-CM | POA: Diagnosis not present

## 2014-10-28 DIAGNOSIS — C787 Secondary malignant neoplasm of liver and intrahepatic bile duct: Secondary | ICD-10-CM | POA: Diagnosis not present

## 2014-10-28 DIAGNOSIS — R112 Nausea with vomiting, unspecified: Secondary | ICD-10-CM | POA: Diagnosis not present

## 2014-10-28 DIAGNOSIS — R1310 Dysphagia, unspecified: Secondary | ICD-10-CM | POA: Diagnosis not present

## 2014-10-28 DIAGNOSIS — I10 Essential (primary) hypertension: Secondary | ICD-10-CM | POA: Diagnosis not present

## 2014-10-28 DIAGNOSIS — C7951 Secondary malignant neoplasm of bone: Secondary | ICD-10-CM | POA: Diagnosis not present

## 2014-10-28 DIAGNOSIS — R0609 Other forms of dyspnea: Secondary | ICD-10-CM | POA: Diagnosis not present

## 2014-10-28 DIAGNOSIS — T451X5S Adverse effect of antineoplastic and immunosuppressive drugs, sequela: Secondary | ICD-10-CM | POA: Diagnosis not present

## 2014-10-28 DIAGNOSIS — D72829 Elevated white blood cell count, unspecified: Secondary | ICD-10-CM | POA: Diagnosis not present

## 2014-10-28 DIAGNOSIS — Z418 Encounter for other procedures for purposes other than remedying health state: Secondary | ICD-10-CM | POA: Diagnosis not present

## 2014-10-28 DIAGNOSIS — R05 Cough: Secondary | ICD-10-CM | POA: Diagnosis not present

## 2014-10-28 DIAGNOSIS — M545 Low back pain: Secondary | ICD-10-CM | POA: Diagnosis not present

## 2014-10-29 DIAGNOSIS — R112 Nausea with vomiting, unspecified: Secondary | ICD-10-CM | POA: Diagnosis not present

## 2014-10-29 DIAGNOSIS — R971 Elevated cancer antigen 125 [CA 125]: Secondary | ICD-10-CM | POA: Diagnosis not present

## 2014-10-29 DIAGNOSIS — R0609 Other forms of dyspnea: Secondary | ICD-10-CM | POA: Diagnosis not present

## 2014-10-29 DIAGNOSIS — I77811 Abdominal aortic ectasia: Secondary | ICD-10-CM | POA: Diagnosis not present

## 2014-10-29 DIAGNOSIS — I10 Essential (primary) hypertension: Secondary | ICD-10-CM | POA: Diagnosis not present

## 2014-10-29 DIAGNOSIS — R1310 Dysphagia, unspecified: Secondary | ICD-10-CM | POA: Diagnosis not present

## 2014-10-29 DIAGNOSIS — F17210 Nicotine dependence, cigarettes, uncomplicated: Secondary | ICD-10-CM | POA: Diagnosis not present

## 2014-10-29 DIAGNOSIS — Z5111 Encounter for antineoplastic chemotherapy: Secondary | ICD-10-CM | POA: Diagnosis not present

## 2014-10-29 DIAGNOSIS — C3491 Malignant neoplasm of unspecified part of right bronchus or lung: Secondary | ICD-10-CM | POA: Diagnosis not present

## 2014-10-29 DIAGNOSIS — C787 Secondary malignant neoplasm of liver and intrahepatic bile duct: Secondary | ICD-10-CM | POA: Diagnosis not present

## 2014-10-29 DIAGNOSIS — C7951 Secondary malignant neoplasm of bone: Secondary | ICD-10-CM | POA: Diagnosis not present

## 2014-10-29 DIAGNOSIS — C781 Secondary malignant neoplasm of mediastinum: Secondary | ICD-10-CM | POA: Diagnosis not present

## 2014-10-29 DIAGNOSIS — D6481 Anemia due to antineoplastic chemotherapy: Secondary | ICD-10-CM | POA: Diagnosis not present

## 2014-10-29 DIAGNOSIS — D7389 Other diseases of spleen: Secondary | ICD-10-CM | POA: Diagnosis not present

## 2014-10-29 DIAGNOSIS — M4856XS Collapsed vertebra, not elsewhere classified, lumbar region, sequela of fracture: Secondary | ICD-10-CM | POA: Diagnosis not present

## 2014-10-29 DIAGNOSIS — Z79899 Other long term (current) drug therapy: Secondary | ICD-10-CM | POA: Diagnosis not present

## 2014-10-29 DIAGNOSIS — D709 Neutropenia, unspecified: Secondary | ICD-10-CM | POA: Diagnosis not present

## 2014-10-29 DIAGNOSIS — D6959 Other secondary thrombocytopenia: Secondary | ICD-10-CM | POA: Diagnosis not present

## 2014-10-29 DIAGNOSIS — D72829 Elevated white blood cell count, unspecified: Secondary | ICD-10-CM | POA: Diagnosis not present

## 2014-10-29 DIAGNOSIS — R05 Cough: Secondary | ICD-10-CM | POA: Diagnosis not present

## 2014-10-29 DIAGNOSIS — Z418 Encounter for other procedures for purposes other than remedying health state: Secondary | ICD-10-CM | POA: Diagnosis not present

## 2014-10-29 DIAGNOSIS — T451X5S Adverse effect of antineoplastic and immunosuppressive drugs, sequela: Secondary | ICD-10-CM | POA: Diagnosis not present

## 2014-10-29 DIAGNOSIS — R970 Elevated carcinoembryonic antigen [CEA]: Secondary | ICD-10-CM | POA: Diagnosis not present

## 2014-10-29 DIAGNOSIS — I7090 Unspecified atherosclerosis: Secondary | ICD-10-CM | POA: Diagnosis not present

## 2014-10-29 DIAGNOSIS — M545 Low back pain: Secondary | ICD-10-CM | POA: Diagnosis not present

## 2014-10-30 DIAGNOSIS — Z79899 Other long term (current) drug therapy: Secondary | ICD-10-CM | POA: Diagnosis not present

## 2014-10-30 DIAGNOSIS — R05 Cough: Secondary | ICD-10-CM | POA: Diagnosis not present

## 2014-10-30 DIAGNOSIS — I7090 Unspecified atherosclerosis: Secondary | ICD-10-CM | POA: Diagnosis not present

## 2014-10-30 DIAGNOSIS — D6959 Other secondary thrombocytopenia: Secondary | ICD-10-CM | POA: Diagnosis not present

## 2014-10-30 DIAGNOSIS — D72829 Elevated white blood cell count, unspecified: Secondary | ICD-10-CM | POA: Diagnosis not present

## 2014-10-30 DIAGNOSIS — D7389 Other diseases of spleen: Secondary | ICD-10-CM | POA: Diagnosis not present

## 2014-10-30 DIAGNOSIS — R0609 Other forms of dyspnea: Secondary | ICD-10-CM | POA: Diagnosis not present

## 2014-10-30 DIAGNOSIS — R1310 Dysphagia, unspecified: Secondary | ICD-10-CM | POA: Diagnosis not present

## 2014-10-30 DIAGNOSIS — D6481 Anemia due to antineoplastic chemotherapy: Secondary | ICD-10-CM | POA: Diagnosis not present

## 2014-10-30 DIAGNOSIS — Z5111 Encounter for antineoplastic chemotherapy: Secondary | ICD-10-CM | POA: Diagnosis not present

## 2014-10-30 DIAGNOSIS — M545 Low back pain: Secondary | ICD-10-CM | POA: Diagnosis not present

## 2014-10-30 DIAGNOSIS — C7951 Secondary malignant neoplasm of bone: Secondary | ICD-10-CM | POA: Diagnosis not present

## 2014-10-30 DIAGNOSIS — D709 Neutropenia, unspecified: Secondary | ICD-10-CM | POA: Diagnosis not present

## 2014-10-30 DIAGNOSIS — F17210 Nicotine dependence, cigarettes, uncomplicated: Secondary | ICD-10-CM | POA: Diagnosis not present

## 2014-10-30 DIAGNOSIS — R971 Elevated cancer antigen 125 [CA 125]: Secondary | ICD-10-CM | POA: Diagnosis not present

## 2014-10-30 DIAGNOSIS — I10 Essential (primary) hypertension: Secondary | ICD-10-CM | POA: Diagnosis not present

## 2014-10-30 DIAGNOSIS — I77811 Abdominal aortic ectasia: Secondary | ICD-10-CM | POA: Diagnosis not present

## 2014-10-30 DIAGNOSIS — C3491 Malignant neoplasm of unspecified part of right bronchus or lung: Secondary | ICD-10-CM | POA: Diagnosis not present

## 2014-10-30 DIAGNOSIS — Z418 Encounter for other procedures for purposes other than remedying health state: Secondary | ICD-10-CM | POA: Diagnosis not present

## 2014-10-30 DIAGNOSIS — C781 Secondary malignant neoplasm of mediastinum: Secondary | ICD-10-CM | POA: Diagnosis not present

## 2014-10-30 DIAGNOSIS — R112 Nausea with vomiting, unspecified: Secondary | ICD-10-CM | POA: Diagnosis not present

## 2014-10-30 DIAGNOSIS — T451X5S Adverse effect of antineoplastic and immunosuppressive drugs, sequela: Secondary | ICD-10-CM | POA: Diagnosis not present

## 2014-10-30 DIAGNOSIS — C787 Secondary malignant neoplasm of liver and intrahepatic bile duct: Secondary | ICD-10-CM | POA: Diagnosis not present

## 2014-10-30 DIAGNOSIS — R970 Elevated carcinoembryonic antigen [CEA]: Secondary | ICD-10-CM | POA: Diagnosis not present

## 2014-10-30 DIAGNOSIS — M4856XS Collapsed vertebra, not elsewhere classified, lumbar region, sequela of fracture: Secondary | ICD-10-CM | POA: Diagnosis not present

## 2014-11-03 DIAGNOSIS — Z418 Encounter for other procedures for purposes other than remedying health state: Secondary | ICD-10-CM | POA: Diagnosis not present

## 2014-11-03 DIAGNOSIS — R0609 Other forms of dyspnea: Secondary | ICD-10-CM | POA: Diagnosis not present

## 2014-11-03 DIAGNOSIS — D6481 Anemia due to antineoplastic chemotherapy: Secondary | ICD-10-CM | POA: Diagnosis not present

## 2014-11-03 DIAGNOSIS — I7090 Unspecified atherosclerosis: Secondary | ICD-10-CM | POA: Diagnosis not present

## 2014-11-03 DIAGNOSIS — T451X5S Adverse effect of antineoplastic and immunosuppressive drugs, sequela: Secondary | ICD-10-CM | POA: Diagnosis not present

## 2014-11-03 DIAGNOSIS — D6959 Other secondary thrombocytopenia: Secondary | ICD-10-CM | POA: Diagnosis not present

## 2014-11-03 DIAGNOSIS — D7389 Other diseases of spleen: Secondary | ICD-10-CM | POA: Diagnosis not present

## 2014-11-03 DIAGNOSIS — C3491 Malignant neoplasm of unspecified part of right bronchus or lung: Secondary | ICD-10-CM | POA: Diagnosis not present

## 2014-11-03 DIAGNOSIS — M4856XS Collapsed vertebra, not elsewhere classified, lumbar region, sequela of fracture: Secondary | ICD-10-CM | POA: Diagnosis not present

## 2014-11-03 DIAGNOSIS — R970 Elevated carcinoembryonic antigen [CEA]: Secondary | ICD-10-CM | POA: Diagnosis not present

## 2014-11-03 DIAGNOSIS — R112 Nausea with vomiting, unspecified: Secondary | ICD-10-CM | POA: Diagnosis not present

## 2014-11-03 DIAGNOSIS — R1310 Dysphagia, unspecified: Secondary | ICD-10-CM | POA: Diagnosis not present

## 2014-11-03 DIAGNOSIS — C7951 Secondary malignant neoplasm of bone: Secondary | ICD-10-CM | POA: Diagnosis not present

## 2014-11-03 DIAGNOSIS — D709 Neutropenia, unspecified: Secondary | ICD-10-CM | POA: Diagnosis not present

## 2014-11-03 DIAGNOSIS — I77811 Abdominal aortic ectasia: Secondary | ICD-10-CM | POA: Diagnosis not present

## 2014-11-03 DIAGNOSIS — M545 Low back pain: Secondary | ICD-10-CM | POA: Diagnosis not present

## 2014-11-03 DIAGNOSIS — C787 Secondary malignant neoplasm of liver and intrahepatic bile duct: Secondary | ICD-10-CM | POA: Diagnosis not present

## 2014-11-03 DIAGNOSIS — Z79899 Other long term (current) drug therapy: Secondary | ICD-10-CM | POA: Diagnosis not present

## 2014-11-03 DIAGNOSIS — R971 Elevated cancer antigen 125 [CA 125]: Secondary | ICD-10-CM | POA: Diagnosis not present

## 2014-11-03 DIAGNOSIS — R05 Cough: Secondary | ICD-10-CM | POA: Diagnosis not present

## 2014-11-03 DIAGNOSIS — F17210 Nicotine dependence, cigarettes, uncomplicated: Secondary | ICD-10-CM | POA: Diagnosis not present

## 2014-11-03 DIAGNOSIS — I10 Essential (primary) hypertension: Secondary | ICD-10-CM | POA: Diagnosis not present

## 2014-11-03 DIAGNOSIS — C781 Secondary malignant neoplasm of mediastinum: Secondary | ICD-10-CM | POA: Diagnosis not present

## 2014-11-03 DIAGNOSIS — D72829 Elevated white blood cell count, unspecified: Secondary | ICD-10-CM | POA: Diagnosis not present

## 2014-11-03 DIAGNOSIS — Z5111 Encounter for antineoplastic chemotherapy: Secondary | ICD-10-CM | POA: Diagnosis not present

## 2014-11-10 DIAGNOSIS — D72829 Elevated white blood cell count, unspecified: Secondary | ICD-10-CM | POA: Diagnosis not present

## 2014-11-10 DIAGNOSIS — Z5111 Encounter for antineoplastic chemotherapy: Secondary | ICD-10-CM | POA: Diagnosis not present

## 2014-11-10 DIAGNOSIS — R0609 Other forms of dyspnea: Secondary | ICD-10-CM | POA: Diagnosis not present

## 2014-11-10 DIAGNOSIS — C781 Secondary malignant neoplasm of mediastinum: Secondary | ICD-10-CM | POA: Diagnosis not present

## 2014-11-10 DIAGNOSIS — C787 Secondary malignant neoplasm of liver and intrahepatic bile duct: Secondary | ICD-10-CM | POA: Diagnosis not present

## 2014-11-10 DIAGNOSIS — C3491 Malignant neoplasm of unspecified part of right bronchus or lung: Secondary | ICD-10-CM | POA: Diagnosis not present

## 2014-11-10 DIAGNOSIS — F17210 Nicotine dependence, cigarettes, uncomplicated: Secondary | ICD-10-CM | POA: Diagnosis not present

## 2014-11-10 DIAGNOSIS — I7090 Unspecified atherosclerosis: Secondary | ICD-10-CM | POA: Diagnosis not present

## 2014-11-10 DIAGNOSIS — I77811 Abdominal aortic ectasia: Secondary | ICD-10-CM | POA: Diagnosis not present

## 2014-11-10 DIAGNOSIS — R971 Elevated cancer antigen 125 [CA 125]: Secondary | ICD-10-CM | POA: Diagnosis not present

## 2014-11-10 DIAGNOSIS — I10 Essential (primary) hypertension: Secondary | ICD-10-CM | POA: Diagnosis not present

## 2014-11-10 DIAGNOSIS — Z418 Encounter for other procedures for purposes other than remedying health state: Secondary | ICD-10-CM | POA: Diagnosis not present

## 2014-11-10 DIAGNOSIS — C3490 Malignant neoplasm of unspecified part of unspecified bronchus or lung: Secondary | ICD-10-CM | POA: Diagnosis not present

## 2014-11-10 DIAGNOSIS — M545 Low back pain: Secondary | ICD-10-CM | POA: Diagnosis not present

## 2014-11-10 DIAGNOSIS — D6959 Other secondary thrombocytopenia: Secondary | ICD-10-CM | POA: Diagnosis not present

## 2014-11-10 DIAGNOSIS — C7951 Secondary malignant neoplasm of bone: Secondary | ICD-10-CM | POA: Diagnosis not present

## 2014-11-10 DIAGNOSIS — M4856XS Collapsed vertebra, not elsewhere classified, lumbar region, sequela of fracture: Secondary | ICD-10-CM | POA: Diagnosis not present

## 2014-11-10 DIAGNOSIS — Z79899 Other long term (current) drug therapy: Secondary | ICD-10-CM | POA: Diagnosis not present

## 2014-11-10 DIAGNOSIS — D6481 Anemia due to antineoplastic chemotherapy: Secondary | ICD-10-CM | POA: Diagnosis not present

## 2014-11-10 DIAGNOSIS — D709 Neutropenia, unspecified: Secondary | ICD-10-CM | POA: Diagnosis not present

## 2014-11-10 DIAGNOSIS — T451X5S Adverse effect of antineoplastic and immunosuppressive drugs, sequela: Secondary | ICD-10-CM | POA: Diagnosis not present

## 2014-11-10 DIAGNOSIS — D7389 Other diseases of spleen: Secondary | ICD-10-CM | POA: Diagnosis not present

## 2014-11-10 DIAGNOSIS — R970 Elevated carcinoembryonic antigen [CEA]: Secondary | ICD-10-CM | POA: Diagnosis not present

## 2014-11-10 DIAGNOSIS — R112 Nausea with vomiting, unspecified: Secondary | ICD-10-CM | POA: Diagnosis not present

## 2014-11-10 DIAGNOSIS — R1310 Dysphagia, unspecified: Secondary | ICD-10-CM | POA: Diagnosis not present

## 2014-11-10 DIAGNOSIS — R05 Cough: Secondary | ICD-10-CM | POA: Diagnosis not present

## 2014-11-10 LAB — CBC CANCER CENTER
BANDS NEUTROPHIL: 12 %
HCT: 28.9 % — AB (ref 35.0–47.0)
HGB: 9.4 g/dL — ABNORMAL LOW (ref 12.0–16.0)
LYMPHS PCT: 35 %
MCH: 33.2 pg (ref 26.0–34.0)
MCHC: 32.5 g/dL (ref 32.0–36.0)
MCV: 102 fL — ABNORMAL HIGH (ref 80–100)
MONOS PCT: 24 %
NRBC/100 WBC: 1 /100
Platelet: 34 x10 3/mm — ABNORMAL LOW (ref 150–440)
RBC: 2.83 10*6/uL — AB (ref 3.80–5.20)
RDW: 17.2 % — ABNORMAL HIGH (ref 11.5–14.5)
SEGMENTED NEUTROPHILS: 27 %
Variant Lymphocyte: 2 %
WBC: 8.7 x10 3/mm (ref 3.6–11.0)

## 2014-11-10 LAB — BASIC METABOLIC PANEL
ANION GAP: 7 (ref 7–16)
BUN: 11 mg/dL
CHLORIDE: 101 mmol/L
CO2: 27 mmol/L
CREATININE: 0.72 mg/dL
Calcium, Total: 8.7 mg/dL — ABNORMAL LOW
Glucose: 109 mg/dL — ABNORMAL HIGH
Potassium: 3.9 mmol/L
Sodium: 135 mmol/L

## 2014-11-14 ENCOUNTER — Ambulatory Visit
Admit: 2014-11-14 | Disposition: A | Discharge status: Home / Self Care | Payer: Self-pay | Attending: Internal Medicine | Admitting: Internal Medicine

## 2014-11-18 LAB — CREATININE, SERUM: Creatine, Serum: 0.72

## 2014-11-24 ENCOUNTER — Other Ambulatory Visit: Payer: Self-pay | Admitting: *Deleted

## 2014-11-24 DIAGNOSIS — F411 Generalized anxiety disorder: Secondary | ICD-10-CM

## 2014-11-24 DIAGNOSIS — I7090 Unspecified atherosclerosis: Secondary | ICD-10-CM | POA: Diagnosis not present

## 2014-11-24 DIAGNOSIS — I77811 Abdominal aortic ectasia: Secondary | ICD-10-CM | POA: Diagnosis not present

## 2014-11-24 DIAGNOSIS — C7951 Secondary malignant neoplasm of bone: Secondary | ICD-10-CM | POA: Diagnosis not present

## 2014-11-24 DIAGNOSIS — C787 Secondary malignant neoplasm of liver and intrahepatic bile duct: Secondary | ICD-10-CM | POA: Diagnosis not present

## 2014-11-24 DIAGNOSIS — D7389 Other diseases of spleen: Secondary | ICD-10-CM | POA: Diagnosis not present

## 2014-11-24 DIAGNOSIS — R630 Anorexia: Secondary | ICD-10-CM | POA: Diagnosis not present

## 2014-11-24 DIAGNOSIS — D6959 Other secondary thrombocytopenia: Secondary | ICD-10-CM | POA: Diagnosis not present

## 2014-11-24 DIAGNOSIS — T451X5S Adverse effect of antineoplastic and immunosuppressive drugs, sequela: Secondary | ICD-10-CM | POA: Diagnosis not present

## 2014-11-24 DIAGNOSIS — M4856XS Collapsed vertebra, not elsewhere classified, lumbar region, sequela of fracture: Secondary | ICD-10-CM | POA: Diagnosis not present

## 2014-11-24 DIAGNOSIS — R970 Elevated carcinoembryonic antigen [CEA]: Secondary | ICD-10-CM | POA: Diagnosis not present

## 2014-11-24 DIAGNOSIS — R971 Elevated cancer antigen 125 [CA 125]: Secondary | ICD-10-CM | POA: Diagnosis not present

## 2014-11-24 DIAGNOSIS — R531 Weakness: Secondary | ICD-10-CM | POA: Diagnosis not present

## 2014-11-24 DIAGNOSIS — R42 Dizziness and giddiness: Secondary | ICD-10-CM | POA: Diagnosis not present

## 2014-11-24 DIAGNOSIS — D709 Neutropenia, unspecified: Secondary | ICD-10-CM | POA: Diagnosis not present

## 2014-11-24 DIAGNOSIS — R112 Nausea with vomiting, unspecified: Secondary | ICD-10-CM | POA: Diagnosis not present

## 2014-11-24 DIAGNOSIS — R634 Abnormal weight loss: Secondary | ICD-10-CM | POA: Diagnosis not present

## 2014-11-24 DIAGNOSIS — D6481 Anemia due to antineoplastic chemotherapy: Secondary | ICD-10-CM | POA: Diagnosis not present

## 2014-11-24 DIAGNOSIS — Z5111 Encounter for antineoplastic chemotherapy: Secondary | ICD-10-CM | POA: Diagnosis not present

## 2014-11-24 DIAGNOSIS — E860 Dehydration: Secondary | ICD-10-CM | POA: Diagnosis not present

## 2014-11-24 DIAGNOSIS — R5383 Other fatigue: Secondary | ICD-10-CM | POA: Diagnosis not present

## 2014-11-24 DIAGNOSIS — Z79899 Other long term (current) drug therapy: Secondary | ICD-10-CM | POA: Diagnosis not present

## 2014-11-24 DIAGNOSIS — C781 Secondary malignant neoplasm of mediastinum: Secondary | ICD-10-CM | POA: Diagnosis not present

## 2014-11-24 DIAGNOSIS — C3491 Malignant neoplasm of unspecified part of right bronchus or lung: Secondary | ICD-10-CM | POA: Diagnosis not present

## 2014-11-24 DIAGNOSIS — Z418 Encounter for other procedures for purposes other than remedying health state: Secondary | ICD-10-CM | POA: Diagnosis not present

## 2014-11-24 DIAGNOSIS — D72829 Elevated white blood cell count, unspecified: Secondary | ICD-10-CM | POA: Diagnosis not present

## 2014-11-24 LAB — BASIC METABOLIC PANEL
Anion Gap: 13 (ref 7–16)
BUN: 11 mg/dL
CHLORIDE: 100 mmol/L — AB
CO2: 25 mmol/L
Calcium, Total: 9.1 mg/dL
Creatinine: 0.64 mg/dL
Glucose: 120 mg/dL — ABNORMAL HIGH
POTASSIUM: 3.7 mmol/L
SODIUM: 138 mmol/L

## 2014-11-24 LAB — CBC CANCER CENTER
BANDS NEUTROPHIL: 3 %
Eosinophil: 2 %
HCT: 32.5 % — ABNORMAL LOW (ref 35.0–47.0)
HGB: 10.4 g/dL — AB (ref 12.0–16.0)
LYMPHS PCT: 23 %
MCH: 32.3 pg (ref 26.0–34.0)
MCHC: 31.9 g/dL — ABNORMAL LOW (ref 32.0–36.0)
MCV: 101 fL — AB (ref 80–100)
MONOS PCT: 19 %
Myelocyte: 1 %
PLATELETS: 100 x10 3/mm — AB (ref 150–440)
RBC: 3.22 10*6/uL — AB (ref 3.80–5.20)
RDW: 19 % — ABNORMAL HIGH (ref 11.5–14.5)
Segmented Neutrophils: 52 %
WBC: 9.1 x10 3/mm (ref 3.6–11.0)

## 2014-11-24 NOTE — Telephone Encounter (Signed)
Last visit 07/30/14

## 2014-11-25 DIAGNOSIS — C3491 Malignant neoplasm of unspecified part of right bronchus or lung: Secondary | ICD-10-CM | POA: Diagnosis not present

## 2014-11-25 DIAGNOSIS — D72829 Elevated white blood cell count, unspecified: Secondary | ICD-10-CM | POA: Diagnosis not present

## 2014-11-25 DIAGNOSIS — Z5111 Encounter for antineoplastic chemotherapy: Secondary | ICD-10-CM | POA: Diagnosis not present

## 2014-11-25 DIAGNOSIS — C781 Secondary malignant neoplasm of mediastinum: Secondary | ICD-10-CM | POA: Diagnosis not present

## 2014-11-25 DIAGNOSIS — R42 Dizziness and giddiness: Secondary | ICD-10-CM | POA: Diagnosis not present

## 2014-11-25 DIAGNOSIS — R970 Elevated carcinoembryonic antigen [CEA]: Secondary | ICD-10-CM | POA: Diagnosis not present

## 2014-11-25 DIAGNOSIS — D6481 Anemia due to antineoplastic chemotherapy: Secondary | ICD-10-CM | POA: Diagnosis not present

## 2014-11-25 DIAGNOSIS — I7090 Unspecified atherosclerosis: Secondary | ICD-10-CM | POA: Diagnosis not present

## 2014-11-25 DIAGNOSIS — R634 Abnormal weight loss: Secondary | ICD-10-CM | POA: Diagnosis not present

## 2014-11-25 DIAGNOSIS — T451X5S Adverse effect of antineoplastic and immunosuppressive drugs, sequela: Secondary | ICD-10-CM | POA: Diagnosis not present

## 2014-11-25 DIAGNOSIS — R112 Nausea with vomiting, unspecified: Secondary | ICD-10-CM | POA: Diagnosis not present

## 2014-11-25 DIAGNOSIS — D6959 Other secondary thrombocytopenia: Secondary | ICD-10-CM | POA: Diagnosis not present

## 2014-11-25 DIAGNOSIS — M4856XS Collapsed vertebra, not elsewhere classified, lumbar region, sequela of fracture: Secondary | ICD-10-CM | POA: Diagnosis not present

## 2014-11-25 DIAGNOSIS — C787 Secondary malignant neoplasm of liver and intrahepatic bile duct: Secondary | ICD-10-CM | POA: Diagnosis not present

## 2014-11-25 DIAGNOSIS — D7389 Other diseases of spleen: Secondary | ICD-10-CM | POA: Diagnosis not present

## 2014-11-25 DIAGNOSIS — C7951 Secondary malignant neoplasm of bone: Secondary | ICD-10-CM | POA: Diagnosis not present

## 2014-11-25 DIAGNOSIS — Z418 Encounter for other procedures for purposes other than remedying health state: Secondary | ICD-10-CM | POA: Diagnosis not present

## 2014-11-25 DIAGNOSIS — R531 Weakness: Secondary | ICD-10-CM | POA: Diagnosis not present

## 2014-11-25 DIAGNOSIS — I77811 Abdominal aortic ectasia: Secondary | ICD-10-CM | POA: Diagnosis not present

## 2014-11-25 DIAGNOSIS — D709 Neutropenia, unspecified: Secondary | ICD-10-CM | POA: Diagnosis not present

## 2014-11-25 DIAGNOSIS — E860 Dehydration: Secondary | ICD-10-CM | POA: Diagnosis not present

## 2014-11-25 DIAGNOSIS — R5383 Other fatigue: Secondary | ICD-10-CM | POA: Diagnosis not present

## 2014-11-25 DIAGNOSIS — R971 Elevated cancer antigen 125 [CA 125]: Secondary | ICD-10-CM | POA: Diagnosis not present

## 2014-11-25 DIAGNOSIS — R630 Anorexia: Secondary | ICD-10-CM | POA: Diagnosis not present

## 2014-11-25 DIAGNOSIS — Z79899 Other long term (current) drug therapy: Secondary | ICD-10-CM | POA: Diagnosis not present

## 2014-11-26 ENCOUNTER — Telehealth: Payer: Self-pay

## 2014-11-26 DIAGNOSIS — R5383 Other fatigue: Secondary | ICD-10-CM | POA: Diagnosis not present

## 2014-11-26 DIAGNOSIS — R112 Nausea with vomiting, unspecified: Secondary | ICD-10-CM | POA: Diagnosis not present

## 2014-11-26 DIAGNOSIS — I77811 Abdominal aortic ectasia: Secondary | ICD-10-CM | POA: Diagnosis not present

## 2014-11-26 DIAGNOSIS — R42 Dizziness and giddiness: Secondary | ICD-10-CM | POA: Diagnosis not present

## 2014-11-26 DIAGNOSIS — C781 Secondary malignant neoplasm of mediastinum: Secondary | ICD-10-CM | POA: Diagnosis not present

## 2014-11-26 DIAGNOSIS — R971 Elevated cancer antigen 125 [CA 125]: Secondary | ICD-10-CM | POA: Diagnosis not present

## 2014-11-26 DIAGNOSIS — R634 Abnormal weight loss: Secondary | ICD-10-CM | POA: Diagnosis not present

## 2014-11-26 DIAGNOSIS — C3491 Malignant neoplasm of unspecified part of right bronchus or lung: Secondary | ICD-10-CM | POA: Diagnosis not present

## 2014-11-26 DIAGNOSIS — C787 Secondary malignant neoplasm of liver and intrahepatic bile duct: Secondary | ICD-10-CM | POA: Diagnosis not present

## 2014-11-26 DIAGNOSIS — Z418 Encounter for other procedures for purposes other than remedying health state: Secondary | ICD-10-CM | POA: Diagnosis not present

## 2014-11-26 DIAGNOSIS — Z79899 Other long term (current) drug therapy: Secondary | ICD-10-CM | POA: Diagnosis not present

## 2014-11-26 DIAGNOSIS — M4856XS Collapsed vertebra, not elsewhere classified, lumbar region, sequela of fracture: Secondary | ICD-10-CM | POA: Diagnosis not present

## 2014-11-26 DIAGNOSIS — R531 Weakness: Secondary | ICD-10-CM | POA: Diagnosis not present

## 2014-11-26 DIAGNOSIS — I7090 Unspecified atherosclerosis: Secondary | ICD-10-CM | POA: Diagnosis not present

## 2014-11-26 DIAGNOSIS — R970 Elevated carcinoembryonic antigen [CEA]: Secondary | ICD-10-CM | POA: Diagnosis not present

## 2014-11-26 DIAGNOSIS — Z5111 Encounter for antineoplastic chemotherapy: Secondary | ICD-10-CM | POA: Diagnosis not present

## 2014-11-26 DIAGNOSIS — D709 Neutropenia, unspecified: Secondary | ICD-10-CM | POA: Diagnosis not present

## 2014-11-26 DIAGNOSIS — E860 Dehydration: Secondary | ICD-10-CM | POA: Diagnosis not present

## 2014-11-26 DIAGNOSIS — R630 Anorexia: Secondary | ICD-10-CM | POA: Diagnosis not present

## 2014-11-26 DIAGNOSIS — D72829 Elevated white blood cell count, unspecified: Secondary | ICD-10-CM | POA: Diagnosis not present

## 2014-11-26 DIAGNOSIS — D7389 Other diseases of spleen: Secondary | ICD-10-CM | POA: Diagnosis not present

## 2014-11-26 DIAGNOSIS — T451X5S Adverse effect of antineoplastic and immunosuppressive drugs, sequela: Secondary | ICD-10-CM | POA: Diagnosis not present

## 2014-11-26 DIAGNOSIS — C7951 Secondary malignant neoplasm of bone: Secondary | ICD-10-CM | POA: Diagnosis not present

## 2014-11-26 DIAGNOSIS — D6481 Anemia due to antineoplastic chemotherapy: Secondary | ICD-10-CM | POA: Diagnosis not present

## 2014-11-26 DIAGNOSIS — D6959 Other secondary thrombocytopenia: Secondary | ICD-10-CM | POA: Diagnosis not present

## 2014-11-26 DIAGNOSIS — F411 Generalized anxiety disorder: Secondary | ICD-10-CM

## 2014-11-26 MED ORDER — ALPRAZOLAM 0.5 MG PO TABS: 0.5000 mg | ORAL_TABLET | Freq: Two times a day (BID) | ORAL | Status: AC | PRN

## 2014-11-26 NOTE — Telephone Encounter (Signed)
Ok to refill,  printed rx  

## 2014-11-26 NOTE — Telephone Encounter (Signed)
Last OV 07/30/14 ok to fill?

## 2014-11-26 NOTE — Telephone Encounter (Signed)
The patient called and stated she has called her pharmacy twice, hoping to get her xanax medication- however, they are stating they do not have the order.

## 2014-11-27 DIAGNOSIS — Z418 Encounter for other procedures for purposes other than remedying health state: Secondary | ICD-10-CM | POA: Diagnosis not present

## 2014-11-27 DIAGNOSIS — D6959 Other secondary thrombocytopenia: Secondary | ICD-10-CM | POA: Diagnosis not present

## 2014-11-27 DIAGNOSIS — E860 Dehydration: Secondary | ICD-10-CM | POA: Diagnosis not present

## 2014-11-27 DIAGNOSIS — D709 Neutropenia, unspecified: Secondary | ICD-10-CM | POA: Diagnosis not present

## 2014-11-27 DIAGNOSIS — R112 Nausea with vomiting, unspecified: Secondary | ICD-10-CM | POA: Diagnosis not present

## 2014-11-27 DIAGNOSIS — C787 Secondary malignant neoplasm of liver and intrahepatic bile duct: Secondary | ICD-10-CM | POA: Diagnosis not present

## 2014-11-27 DIAGNOSIS — R634 Abnormal weight loss: Secondary | ICD-10-CM | POA: Diagnosis not present

## 2014-11-27 DIAGNOSIS — R42 Dizziness and giddiness: Secondary | ICD-10-CM | POA: Diagnosis not present

## 2014-11-27 DIAGNOSIS — M4856XS Collapsed vertebra, not elsewhere classified, lumbar region, sequela of fracture: Secondary | ICD-10-CM | POA: Diagnosis not present

## 2014-11-27 DIAGNOSIS — D6481 Anemia due to antineoplastic chemotherapy: Secondary | ICD-10-CM | POA: Diagnosis not present

## 2014-11-27 DIAGNOSIS — C781 Secondary malignant neoplasm of mediastinum: Secondary | ICD-10-CM | POA: Diagnosis not present

## 2014-11-27 DIAGNOSIS — I7090 Unspecified atherosclerosis: Secondary | ICD-10-CM | POA: Diagnosis not present

## 2014-11-27 DIAGNOSIS — Z79899 Other long term (current) drug therapy: Secondary | ICD-10-CM | POA: Diagnosis not present

## 2014-11-27 DIAGNOSIS — C7951 Secondary malignant neoplasm of bone: Secondary | ICD-10-CM | POA: Diagnosis not present

## 2014-11-27 DIAGNOSIS — R970 Elevated carcinoembryonic antigen [CEA]: Secondary | ICD-10-CM | POA: Diagnosis not present

## 2014-11-27 DIAGNOSIS — D7389 Other diseases of spleen: Secondary | ICD-10-CM | POA: Diagnosis not present

## 2014-11-27 DIAGNOSIS — R5383 Other fatigue: Secondary | ICD-10-CM | POA: Diagnosis not present

## 2014-11-27 DIAGNOSIS — Z5111 Encounter for antineoplastic chemotherapy: Secondary | ICD-10-CM | POA: Diagnosis not present

## 2014-11-27 DIAGNOSIS — T451X5S Adverse effect of antineoplastic and immunosuppressive drugs, sequela: Secondary | ICD-10-CM | POA: Diagnosis not present

## 2014-11-27 DIAGNOSIS — R630 Anorexia: Secondary | ICD-10-CM | POA: Diagnosis not present

## 2014-11-27 DIAGNOSIS — I77811 Abdominal aortic ectasia: Secondary | ICD-10-CM | POA: Diagnosis not present

## 2014-11-27 DIAGNOSIS — D72829 Elevated white blood cell count, unspecified: Secondary | ICD-10-CM | POA: Diagnosis not present

## 2014-11-27 DIAGNOSIS — R531 Weakness: Secondary | ICD-10-CM | POA: Diagnosis not present

## 2014-11-27 DIAGNOSIS — C3491 Malignant neoplasm of unspecified part of right bronchus or lung: Secondary | ICD-10-CM | POA: Diagnosis not present

## 2014-11-27 DIAGNOSIS — R971 Elevated cancer antigen 125 [CA 125]: Secondary | ICD-10-CM | POA: Diagnosis not present

## 2014-11-27 NOTE — Telephone Encounter (Signed)
This was signed yesterday

## 2014-11-27 NOTE — Telephone Encounter (Signed)
Script faxed.

## 2014-12-01 DIAGNOSIS — Z5111 Encounter for antineoplastic chemotherapy: Secondary | ICD-10-CM | POA: Diagnosis not present

## 2014-12-01 DIAGNOSIS — M4856XS Collapsed vertebra, not elsewhere classified, lumbar region, sequela of fracture: Secondary | ICD-10-CM | POA: Diagnosis not present

## 2014-12-01 DIAGNOSIS — D709 Neutropenia, unspecified: Secondary | ICD-10-CM | POA: Diagnosis not present

## 2014-12-01 DIAGNOSIS — C3491 Malignant neoplasm of unspecified part of right bronchus or lung: Secondary | ICD-10-CM | POA: Diagnosis not present

## 2014-12-01 DIAGNOSIS — R112 Nausea with vomiting, unspecified: Secondary | ICD-10-CM | POA: Diagnosis not present

## 2014-12-01 DIAGNOSIS — D6481 Anemia due to antineoplastic chemotherapy: Secondary | ICD-10-CM | POA: Diagnosis not present

## 2014-12-01 DIAGNOSIS — T451X5S Adverse effect of antineoplastic and immunosuppressive drugs, sequela: Secondary | ICD-10-CM | POA: Diagnosis not present

## 2014-12-01 DIAGNOSIS — R531 Weakness: Secondary | ICD-10-CM | POA: Diagnosis not present

## 2014-12-01 DIAGNOSIS — D72829 Elevated white blood cell count, unspecified: Secondary | ICD-10-CM | POA: Diagnosis not present

## 2014-12-01 DIAGNOSIS — R630 Anorexia: Secondary | ICD-10-CM | POA: Diagnosis not present

## 2014-12-01 DIAGNOSIS — Z79899 Other long term (current) drug therapy: Secondary | ICD-10-CM | POA: Diagnosis not present

## 2014-12-01 DIAGNOSIS — C787 Secondary malignant neoplasm of liver and intrahepatic bile duct: Secondary | ICD-10-CM | POA: Diagnosis not present

## 2014-12-01 DIAGNOSIS — Z418 Encounter for other procedures for purposes other than remedying health state: Secondary | ICD-10-CM | POA: Diagnosis not present

## 2014-12-01 DIAGNOSIS — R970 Elevated carcinoembryonic antigen [CEA]: Secondary | ICD-10-CM | POA: Diagnosis not present

## 2014-12-01 DIAGNOSIS — R971 Elevated cancer antigen 125 [CA 125]: Secondary | ICD-10-CM | POA: Diagnosis not present

## 2014-12-01 DIAGNOSIS — I77811 Abdominal aortic ectasia: Secondary | ICD-10-CM | POA: Diagnosis not present

## 2014-12-01 DIAGNOSIS — R634 Abnormal weight loss: Secondary | ICD-10-CM | POA: Diagnosis not present

## 2014-12-01 DIAGNOSIS — D7389 Other diseases of spleen: Secondary | ICD-10-CM | POA: Diagnosis not present

## 2014-12-01 DIAGNOSIS — E860 Dehydration: Secondary | ICD-10-CM | POA: Diagnosis not present

## 2014-12-01 DIAGNOSIS — R42 Dizziness and giddiness: Secondary | ICD-10-CM | POA: Diagnosis not present

## 2014-12-01 DIAGNOSIS — R5383 Other fatigue: Secondary | ICD-10-CM | POA: Diagnosis not present

## 2014-12-01 DIAGNOSIS — C781 Secondary malignant neoplasm of mediastinum: Secondary | ICD-10-CM | POA: Diagnosis not present

## 2014-12-01 DIAGNOSIS — I7090 Unspecified atherosclerosis: Secondary | ICD-10-CM | POA: Diagnosis not present

## 2014-12-01 DIAGNOSIS — C7951 Secondary malignant neoplasm of bone: Secondary | ICD-10-CM | POA: Diagnosis not present

## 2014-12-01 DIAGNOSIS — D6959 Other secondary thrombocytopenia: Secondary | ICD-10-CM | POA: Diagnosis not present

## 2014-12-01 LAB — CBC CANCER CENTER
BASOS PCT: 0.3 %
Basophil #: 0 x10 3/mm (ref 0.0–0.1)
EOS PCT: 0.7 %
Eosinophil #: 0 x10 3/mm (ref 0.0–0.7)
HCT: 27.2 % — AB (ref 35.0–47.0)
HGB: 8.8 g/dL — ABNORMAL LOW (ref 12.0–16.0)
LYMPHS PCT: 13.3 %
Lymphocyte #: 0.7 x10 3/mm — ABNORMAL LOW (ref 1.0–3.6)
MCH: 32.2 pg (ref 26.0–34.0)
MCHC: 32.4 g/dL (ref 32.0–36.0)
MCV: 99 fL (ref 80–100)
MONOS PCT: 0.1 %
Monocyte #: 0 x10 3/mm — ABNORMAL LOW (ref 0.2–0.9)
NEUTROS ABS: 4.8 x10 3/mm (ref 1.4–6.5)
Neutrophil %: 85.6 %
Platelet: 49 x10 3/mm — ABNORMAL LOW (ref 150–440)
RBC: 2.73 10*6/uL — AB (ref 3.80–5.20)
RDW: 18.3 % — ABNORMAL HIGH (ref 11.5–14.5)
WBC: 5.6 x10 3/mm (ref 3.6–11.0)

## 2014-12-01 LAB — CREATININE, SERUM: CREATININE: 0.69 mg/dL

## 2014-12-05 ENCOUNTER — Inpatient Hospital Stay
Admit: 2014-12-05 | Disposition: A | Discharge status: Home / Self Care | Payer: Self-pay | Attending: Internal Medicine | Admitting: Internal Medicine

## 2014-12-05 DIAGNOSIS — E785 Hyperlipidemia, unspecified: Secondary | ICD-10-CM | POA: Diagnosis not present

## 2014-12-05 DIAGNOSIS — C781 Secondary malignant neoplasm of mediastinum: Secondary | ICD-10-CM | POA: Diagnosis not present

## 2014-12-05 DIAGNOSIS — K5790 Diverticulosis of intestine, part unspecified, without perforation or abscess without bleeding: Secondary | ICD-10-CM | POA: Diagnosis not present

## 2014-12-05 DIAGNOSIS — R739 Hyperglycemia, unspecified: Secondary | ICD-10-CM | POA: Diagnosis not present

## 2014-12-05 DIAGNOSIS — I10 Essential (primary) hypertension: Secondary | ICD-10-CM | POA: Diagnosis not present

## 2014-12-05 DIAGNOSIS — Z9849 Cataract extraction status, unspecified eye: Secondary | ICD-10-CM | POA: Diagnosis not present

## 2014-12-05 DIAGNOSIS — H409 Unspecified glaucoma: Secondary | ICD-10-CM | POA: Diagnosis not present

## 2014-12-05 DIAGNOSIS — C787 Secondary malignant neoplasm of liver and intrahepatic bile duct: Secondary | ICD-10-CM | POA: Diagnosis not present

## 2014-12-05 DIAGNOSIS — R55 Syncope and collapse: Secondary | ICD-10-CM | POA: Diagnosis not present

## 2014-12-05 DIAGNOSIS — D61810 Antineoplastic chemotherapy induced pancytopenia: Secondary | ICD-10-CM | POA: Diagnosis not present

## 2014-12-05 DIAGNOSIS — E860 Dehydration: Secondary | ICD-10-CM | POA: Diagnosis not present

## 2014-12-05 DIAGNOSIS — D61818 Other pancytopenia: Secondary | ICD-10-CM | POA: Diagnosis not present

## 2014-12-05 DIAGNOSIS — R918 Other nonspecific abnormal finding of lung field: Secondary | ICD-10-CM | POA: Diagnosis not present

## 2014-12-05 DIAGNOSIS — T451X5A Adverse effect of antineoplastic and immunosuppressive drugs, initial encounter: Secondary | ICD-10-CM | POA: Diagnosis not present

## 2014-12-05 DIAGNOSIS — F419 Anxiety disorder, unspecified: Secondary | ICD-10-CM | POA: Diagnosis not present

## 2014-12-05 DIAGNOSIS — Z87891 Personal history of nicotine dependence: Secondary | ICD-10-CM | POA: Diagnosis not present

## 2014-12-05 DIAGNOSIS — Z880 Allergy status to penicillin: Secondary | ICD-10-CM | POA: Diagnosis not present

## 2014-12-05 DIAGNOSIS — Z6820 Body mass index (BMI) 20.0-20.9, adult: Secondary | ICD-10-CM | POA: Diagnosis not present

## 2014-12-05 DIAGNOSIS — G8929 Other chronic pain: Secondary | ICD-10-CM | POA: Diagnosis not present

## 2014-12-05 DIAGNOSIS — F411 Generalized anxiety disorder: Secondary | ICD-10-CM | POA: Diagnosis not present

## 2014-12-05 DIAGNOSIS — R627 Adult failure to thrive: Secondary | ICD-10-CM | POA: Diagnosis not present

## 2014-12-05 DIAGNOSIS — C7951 Secondary malignant neoplasm of bone: Secondary | ICD-10-CM | POA: Diagnosis not present

## 2014-12-05 DIAGNOSIS — E876 Hypokalemia: Secondary | ICD-10-CM | POA: Diagnosis not present

## 2014-12-05 DIAGNOSIS — C3491 Malignant neoplasm of unspecified part of right bronchus or lung: Secondary | ICD-10-CM | POA: Diagnosis not present

## 2014-12-05 DIAGNOSIS — R5383 Other fatigue: Secondary | ICD-10-CM | POA: Diagnosis not present

## 2014-12-05 DIAGNOSIS — M4854XA Collapsed vertebra, not elsewhere classified, thoracic region, initial encounter for fracture: Secondary | ICD-10-CM | POA: Diagnosis not present

## 2014-12-05 DIAGNOSIS — M549 Dorsalgia, unspecified: Secondary | ICD-10-CM | POA: Diagnosis not present

## 2014-12-05 DIAGNOSIS — M810 Age-related osteoporosis without current pathological fracture: Secondary | ICD-10-CM | POA: Diagnosis not present

## 2014-12-05 DIAGNOSIS — C3490 Malignant neoplasm of unspecified part of unspecified bronchus or lung: Secondary | ICD-10-CM | POA: Diagnosis not present

## 2014-12-05 DIAGNOSIS — Z79899 Other long term (current) drug therapy: Secondary | ICD-10-CM | POA: Diagnosis not present

## 2014-12-05 DIAGNOSIS — Z9221 Personal history of antineoplastic chemotherapy: Secondary | ICD-10-CM | POA: Diagnosis not present

## 2014-12-05 DIAGNOSIS — M6281 Muscle weakness (generalized): Secondary | ICD-10-CM | POA: Diagnosis not present

## 2014-12-05 DIAGNOSIS — R110 Nausea: Secondary | ICD-10-CM | POA: Diagnosis not present

## 2014-12-05 LAB — BASIC METABOLIC PANEL
Anion Gap: 8 (ref 7–16)
BUN: 14 mg/dL
CO2: 25 mmol/L
CREATININE: 0.72 mg/dL
Calcium, Total: 8.5 mg/dL — ABNORMAL LOW
Chloride: 104 mmol/L
EGFR (African American): >60
EGFR (Non-African Amer.): >60
GLUCOSE: 112 mg/dL — AB
POTASSIUM: 3.2 mmol/L — AB
SODIUM: 137 mmol/L

## 2014-12-05 LAB — URINALYSIS, COMPLETE
Bilirubin,UR: NEGATIVE
Blood: NEGATIVE
Glucose,UR: NEGATIVE mg/dL (ref 0–75)
Ketone: NEGATIVE
LEUKOCYTE ESTERASE: NEGATIVE
NITRITE: NEGATIVE
PH: 5 (ref 4.5–8.0)
PROTEIN: NEGATIVE
Specific Gravity: 1.017 (ref 1.003–1.030)

## 2014-12-05 LAB — TROPONIN I: Troponin-I: <0.03 ng/mL

## 2014-12-05 LAB — HEPATIC FUNCTION PANEL A (ARMC)
ALT: 14 U/L
AST: 21 U/L
Albumin: 4.1 g/dL
Alkaline Phosphatase: 80 U/L
BILIRUBIN TOTAL: 0.4 mg/dL
Bilirubin, Direct: <0.1 mg/dL
Total Protein: 7.4 g/dL

## 2014-12-05 LAB — HEMOGLOBIN A1C: HEMOGLOBIN A1C: 6 %

## 2014-12-05 LAB — MAGNESIUM: Magnesium: 1.1 mg/dL — ABNORMAL LOW

## 2014-12-06 LAB — COMPREHENSIVE METABOLIC PANEL
ALK PHOS: 71 U/L
ANION GAP: 9 (ref 7–16)
AST: 15 U/L
Albumin: 3.8 g/dL
BUN: 13 mg/dL
Bilirubin,Total: 1.1 mg/dL
CALCIUM: 8.4 mg/dL — AB
CREATININE: 0.64 mg/dL
Chloride: 103 mmol/L
Co2: 26 mmol/L
EGFR (African American): >60
EGFR (Non-African Amer.): >60
Glucose: 96 mg/dL
POTASSIUM: 3.1 mmol/L — AB
SGPT (ALT): 11 U/L — ABNORMAL LOW
SODIUM: 138 mmol/L
Total Protein: 6.6 g/dL

## 2014-12-06 LAB — CBC WITH DIFFERENTIAL/PLATELET
BASOS ABS: 0 10*3/uL (ref 0.0–0.1)
Basophil %: 0.3 %
Eosinophil #: 0 10*3/uL (ref 0.0–0.7)
Eosinophil %: 0.5 %
HCT: 21 % — ABNORMAL LOW (ref 35.0–47.0)
HGB: 7 g/dL — ABNORMAL LOW (ref 12.0–16.0)
Lymphocyte #: 1.7 10*3/uL (ref 1.0–3.6)
Lymphocyte %: 41.8 %
MCH: 32 pg (ref 26.0–34.0)
MCHC: 33.5 g/dL (ref 32.0–36.0)
MCV: 96 fL (ref 80–100)
MONO ABS: 1.3 x10 3/mm — AB (ref 0.2–0.9)
MONOS PCT: 32.1 %
Neutrophil #: 1 10*3/uL — ABNORMAL LOW (ref 1.4–6.5)
Neutrophil %: 25.3 %
PLATELETS: 30 10*3/uL — AB (ref 150–440)
RBC: 2.2 10*6/uL — ABNORMAL LOW (ref 3.80–5.20)
RDW: 19.7 % — ABNORMAL HIGH (ref 11.5–14.5)
WBC: 4.1 10*3/uL (ref 3.6–11.0)

## 2014-12-06 NOTE — H&P (Signed)
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   HISTORY OF PRESENT ILLNESS: The patient is a 80 year old female with past medical history significant for hypertension, generalized anxiety disorder, diverticulosis, chronic back pain, history of upper gastrointestinal bleed, hyperlipidemia, osteoporosis, glaucoma, cataract extraction, history of melanoma resected at age 67; chronic smoking who recently had GI symptoms and unintentional weight loss. CT scan done showed multiple liver lesions, abnormality in the T9 vertebrae vertebral body, raising suspicion for metastatic malignancy. CT scan of the chest showed right supraclavicular mass along with right hilar and mediastinal mass/adenopathy. The patient has had difficulty swallowing and she had recent EGD with dilatation. States that she continues to have difficulty swallowing with most foods and can get down some liquids and has not been eating and drinking well past few days and almost felt like she was passing out 2 to 3 days ago. She had a CT-guided supraclavicular mass biopsy done yesterday with preliminary report consistent with metastatic small cell lung cancer; denies any headaches, seizures or loss of consciousness. Continues to have intermittent chest discomfort and takes Norco as needed. No fever or chills. Oral intake is extremely poor and she is feeling weak and lightheaded on getting up and ambulating.   PAST MEDICAL AND SURGICAL HISTORY: As in history of present illness above.   FAMILY HISTORY: Noncontributory.   SOCIAL HISTORY: Quit smoking October 2015, a 70 pack-year history; denies alcohol usage; fairly physically active up until recently.   HOME MEDICATIONS: 1.  Norco 5/325 mg 1 to 2  tablets q. 6 hours as needed.  2.  Xanax 0.5 mg b.i.d. p.r.n.  3.  Amlodipine 10 mg daily.  4.  Calcium 600/200 plus vitamin D once daily.  5.  Diazepam 5 mg q. 12 hours p.r.n.  6.  Reglan 10 mg p.o. q.i.d.  7.  Hydrochlorothiazide 12.5 mg daily.  8.  Multivitamin 1 daily.  9.  Zofran 4 mg q. 4 hours p.r.n.  10.  Protonix 40 mg b.i.d.   REVIEW OF SYSTEMS:  CONSTITUTIONAL: As in HPI, no fever or chills.  HEENT: No headaches, epistaxis, ear or jaw pain, no sinus symptoms.  CARDIAC: No angina, palpitation, orthopnea, or PND.  LUNGS: Has chronic cough and dyspnea on exertion, no hemoptysis, intermittent chest discomfort/pain.  GASTROINTESTINAL: Having nausea and intermittent vomiting, especially on trying to eat, given dysphagia; no bright red blood in stools or melena, no hematemesis.  GENITOURINARY: No dysuria or hematuria. Urine is darker in color lately.  SKIN: No new rashes or pruritus.  HEMATOLOGIC: Denies obvious bleeding issues.  EXTREMITIES: No new pain or swelling.  NEUROLOGIC: No new focal weakness or seizures.  ENDOCRINE: No polyuria or polydipsia, appetite borderline.   ALLERGIES: INCLUDE PENICILLIN.   PHYSICAL EXAMINATION: GENERAL: The patient is a moderately built and frail-looking individual, sitting in wheelchair, weak and tired, otherwise, alert and oriented to self, place, person, and time and converses appropriately. No acute distress at rest. No icterus.  VITAL SIGNS: Temperature 97.5, pulse 98, respirations 18, blood pressure 116/70, sat 95% on room air.  HEENT: Normocephalic, atraumatic, extraocular movements intact, sclerae anicteric. No oral thrush. Mouth is dry.  NECK: Negative for lymphadenopathy but right supraclavicular mass palpable with dressing over after biopsy yesterday.  CARDIOVASCULAR: S1, S2, regular  rate and rhythm.  LUNGS: Bilateral diminished breath sounds overall. No rhonchi or crepitation noted.  ABDOMEN: Soft, liver just palpable and mildly  tender. No other masses palpable, bowel sounds present.  EXTREMITIES: Shows no major edema or cyanosis.  SKIN: Shows no generalized rashes or major bruising.  NEUROLOGIC: Limited exam, cranial nerves intact, moves all extremities spontaneously. Gait not checked.   LABORATORY RESULTS: WBC 12.3, hemoglobin 10.9, platelets 362,000, with 70% neutrophils, creatinine 1.5, potassium 3.8, calcium 8.5, alkaline phosphatase 670, ALT 136, AST 369.   IMPRESSION AND PLAN: 1.  Dysphagia, nausea and vomiting, clinical dehydration. The patient had recent EGD with no obvious intraluminal abnormality and also had dilatation done. Despite this continues to have dysphagia and this is likely from extremely large tumor in the right hilum/mediastinum likely causing pressure on the esophagus. Given significant symptoms of dehydration and weakness, we will admit to hospital and start on IV hydration with D5 half-normal saline at 125 mL per hour and monitor urine output and symptoms. We will monitor metabolic panel including renal functions and electrolytes. Continue on Reglan as needed and Zofran as needed for nausea. Liver functions are also abnormal likely from metastatic malignancy and this might be contributing to her nausea too.  2.  Newly diagnosed stage IV metastatic small cell right lung cancer with metastases to mediastinal lymphadenopathy, right supraclavicular lymph nodes, bones (T9 vertebra), and liver metastasis which is the likely cause of abnormal liver functions. The patient was explained about preliminary biopsy consistent with small cell lung cancer, that it is stage IV disease and incurable and overall prognosis is poor. I also explained that any treatment offered is with palliative intent only and discussed treatment options with palliative chemotherapy versus supportive care. I have also explained overall, possible response rates, palliative intent of chemotherapy and possible side effects of regimen with  carboplatin/etoposide. The patient wants to try and pursue chemotherapy at this time. She will be started on IV hydration today. We will repeat metabolic panel tomorrow and pursue cycle 1 chemotherapy; with carboplatin AUC of 4 IV and etoposide 80 mg/sq m per day IV on days 1 to 3. We will consider Neulasta injection on day 4, given high risk of myelosuppression and febrile neutropenia (20% or higher). Continue to monitor renal functions and electrolytes and supplement as indicated.  3.  Continue other home medications same as before. We will change to IV wherever possible since she has difficulty swallowing number of pills. We will also hold hydrochlorothiazide given dehydration.  4.  The patient (and her daughter Moesha Sarchet, over the phone) have been explained about details and they are agreeable to this plan.    ____________________________ Rhett Bannister. Ma Hillock, MD srp:nt D: 06/24/2014 16:48:47 ET T: 06/24/2014 17:15:39 ET JOB#: [id number]  cc: Oriana Horiuchi R. Ma Hillock, MD, <Dictator> Alveta Heimlich MD ELECTRONICALLY SIGNED 06/24/2014 23:57

## 2014-12-06 NOTE — Discharge Summary (Signed)
PATIENT NAME:  Nancy Doyle, Nancy Doyle MR#:  [id number] DATE OF BIRTH:  1935-04-25  DATE OF ADMISSION:  06/24/2014 DATE OF DISCHARGE:  06/30/2014  DISCHARGE DIAGNOSES:  1.  Stage IV small cell lung cancer-got cycle 1 chemotherapy with carboplatin/etoposide on November 11-13, 2015.  2.  Vomiting, dysphagia, dehydration.   HISTORY OF PRESENT ILLNESS: The patient is a 80 year old female with newly diagnosed stage IV metastatic small cell lung cancer with liver metastasis, large chest mass with dysphagia. The patient presented to cancer center on November 10 with complaints of persistent vomiting with inability to swallow and clinical dehydration and significant weakness. She had recent EGD with dilatation and still continued to have difficulty swallowing and it was felt that it was likely from pressure on esophagus by the large hilar/mediastinal mass from lung cancer. She also had intermittent chest discomfort and was taking Norco as needed. She did not have any fever or chills. Oral intake was extremely poor and she was feeling weak and lightheaded on getting up and ambulating.   Past medical history and surgical history, social history, home medications, review of systems, examination and laboratory findings-please refer to history and physical for details.   HOSPITAL COURSE: Laboratories on admission showed creatinine mildly elevated at 1.5, calcium 8.5, hemoglobin 10.9, WBC 12,300 with 70% neutrophils. The patient was started on IV hydration with D5 half-normal saline at 125 mL per hour and urine output and electrolytes were monitored. She had mild hypokalemia and hypomagnesemia which were supplemented as indicated. She also had some hypocalcemia which was supplemented. She did not have any symptoms referable to these. The patient's dysphagia continued, and once she was adequately hydrated, she was started on palliative chemotherapy after explaining in detail about stage IV lung cancer being incurable stage  disease, the treatments offered are with palliative intent only, and overall prognosis is poor. Also had explained possible response rates and side effects from chemotherapy regimen with carboplatin/etoposide, she was agreeable to take chemotherapy and expressed verbal consent. She received cycle 1, day 1 carboplatin AUC of 4 IV on November 11, etoposide 80 mg per meter squared per day IV on November 11, 12, 13. She tolerated this fairly well. Around day 5 her dysphagia was beginning to get better consistently and she was able to eat mechanical soft diet. She also had some mild hypoxia which was likely from fluid retention and therefore IV fluid was decreased and she was given IV Lasix, after which her shortness of breath improved and she came off of oxygen. On November 16, the patient was doing better, off of oxygen, able to swallow better, and denied any dizziness or lightheadedness on ambulating short distances to bathroom. She was therefore discharged home and advised to call or come to the ER in case of any recurrent sickness or symptoms.   DISCHARGE MEDICATIONS: Reglan 10 mg q.i.d. before meals and at bedtime, ondansetron 4 mg 2 tablets 4 times a day as needed for nausea, Xanax 0.5 mg b.i.d. p.r.n. for anxiety, amlodipine 10 mg once daily, calcium 600 + D 200 units 1 tablet daily, HCTZ 12.5 mg once daily, multivitamin 1 tablet daily, pantoprazole 40 mg b.i.d., Norco 325/5 one to 2 tablets q. 6  hours p.r.n. for pain, Senokot-S 1 tablet at bedtime, milk of magnesia 15 mL p.o. t.i.d. p.r.n. constipation, latanoprost 0.005% ophthalmic solution 1 drop to each eye at bedtime.   DISCHARGE DIET: Mechanical soft, low sodium.   ACTIVITY: As tolerated, ambulate with support of walker.   FOLLOWUP  APPOINTMENTS:   1.  Get Neulasta injection at cancer center.  2.  Get CBC, metabolic panel on November 19 and on November 25.  3.  See Dr. Ma Hillock at [facility] on December 3 with CBC, met-B, LFT.     ____________________________ Rhett Bannister. Ma Hillock, MD srp:bu D: 07/02/2014 09:03:21 ET T: 07/02/2014 13:08:33 ET JOB#: [id number]  cc: Ezrah Panning R. Ma Hillock, MD, <Dictator> Alveta Heimlich MD ELECTRONICALLY SIGNED 07/02/2014 16:29

## 2014-12-08 LAB — SURGICAL PATHOLOGY

## 2014-12-11 ENCOUNTER — Other Ambulatory Visit: Payer: Self-pay | Admitting: Internal Medicine

## 2014-12-11 DIAGNOSIS — D72829 Elevated white blood cell count, unspecified: Secondary | ICD-10-CM | POA: Diagnosis not present

## 2014-12-11 DIAGNOSIS — R970 Elevated carcinoembryonic antigen [CEA]: Secondary | ICD-10-CM | POA: Diagnosis not present

## 2014-12-11 DIAGNOSIS — C787 Secondary malignant neoplasm of liver and intrahepatic bile duct: Secondary | ICD-10-CM | POA: Diagnosis not present

## 2014-12-11 DIAGNOSIS — C7951 Secondary malignant neoplasm of bone: Secondary | ICD-10-CM

## 2014-12-11 DIAGNOSIS — C3491 Malignant neoplasm of unspecified part of right bronchus or lung: Secondary | ICD-10-CM

## 2014-12-11 DIAGNOSIS — D709 Neutropenia, unspecified: Secondary | ICD-10-CM | POA: Diagnosis not present

## 2014-12-11 DIAGNOSIS — R42 Dizziness and giddiness: Secondary | ICD-10-CM | POA: Diagnosis not present

## 2014-12-11 DIAGNOSIS — T451X5S Adverse effect of antineoplastic and immunosuppressive drugs, sequela: Secondary | ICD-10-CM | POA: Diagnosis not present

## 2014-12-11 DIAGNOSIS — M4856XS Collapsed vertebra, not elsewhere classified, lumbar region, sequela of fracture: Secondary | ICD-10-CM | POA: Diagnosis not present

## 2014-12-11 DIAGNOSIS — C781 Secondary malignant neoplasm of mediastinum: Secondary | ICD-10-CM

## 2014-12-11 DIAGNOSIS — Z79899 Other long term (current) drug therapy: Secondary | ICD-10-CM | POA: Diagnosis not present

## 2014-12-11 DIAGNOSIS — E860 Dehydration: Secondary | ICD-10-CM | POA: Diagnosis not present

## 2014-12-11 DIAGNOSIS — C7952 Secondary malignant neoplasm of bone marrow: Secondary | ICD-10-CM

## 2014-12-11 DIAGNOSIS — D7389 Other diseases of spleen: Secondary | ICD-10-CM | POA: Diagnosis not present

## 2014-12-11 DIAGNOSIS — I77811 Abdominal aortic ectasia: Secondary | ICD-10-CM | POA: Diagnosis not present

## 2014-12-11 DIAGNOSIS — Z5111 Encounter for antineoplastic chemotherapy: Secondary | ICD-10-CM | POA: Diagnosis not present

## 2014-12-11 DIAGNOSIS — R531 Weakness: Secondary | ICD-10-CM | POA: Diagnosis not present

## 2014-12-11 DIAGNOSIS — Z418 Encounter for other procedures for purposes other than remedying health state: Secondary | ICD-10-CM | POA: Diagnosis not present

## 2014-12-11 DIAGNOSIS — R112 Nausea with vomiting, unspecified: Secondary | ICD-10-CM | POA: Diagnosis not present

## 2014-12-11 DIAGNOSIS — R5383 Other fatigue: Secondary | ICD-10-CM | POA: Diagnosis not present

## 2014-12-11 DIAGNOSIS — D6959 Other secondary thrombocytopenia: Secondary | ICD-10-CM | POA: Diagnosis not present

## 2014-12-11 DIAGNOSIS — D6481 Anemia due to antineoplastic chemotherapy: Secondary | ICD-10-CM | POA: Diagnosis not present

## 2014-12-11 DIAGNOSIS — R971 Elevated cancer antigen 125 [CA 125]: Secondary | ICD-10-CM | POA: Diagnosis not present

## 2014-12-11 DIAGNOSIS — I7090 Unspecified atherosclerosis: Secondary | ICD-10-CM | POA: Diagnosis not present

## 2014-12-11 DIAGNOSIS — R634 Abnormal weight loss: Secondary | ICD-10-CM | POA: Diagnosis not present

## 2014-12-11 DIAGNOSIS — R630 Anorexia: Secondary | ICD-10-CM | POA: Diagnosis not present

## 2014-12-11 LAB — HEPATIC FUNCTION PANEL A (ARMC)
ALK PHOS: 83 U/L
Albumin: 4.4 g/dL
BILIRUBIN TOTAL: 0.6 mg/dL
Bilirubin, Direct: 0.1 mg/dL
Indirect Bilirubin: 0.5
SGOT(AST): 20 U/L
SGPT (ALT): 12 U/L — ABNORMAL LOW
Total Protein: 7.8 g/dL

## 2014-12-11 LAB — CBC CANCER CENTER
Basophil #: 0 x10 3/mm (ref 0.0–0.1)
Basophil %: 0.3 %
Eosinophil #: 0 x10 3/mm (ref 0.0–0.7)
Eosinophil %: 0.1 %
HCT: 26.8 % — ABNORMAL LOW (ref 35.0–47.0)
HGB: 8.5 g/dL — ABNORMAL LOW (ref 12.0–16.0)
LYMPHS PCT: 9.8 %
Lymphocyte #: 1.1 x10 3/mm (ref 1.0–3.6)
MCH: 31.7 pg (ref 26.0–34.0)
MCHC: 31.7 g/dL — ABNORMAL LOW (ref 32.0–36.0)
MCV: 100 fL (ref 80–100)
MONOS PCT: 10.3 %
Monocyte #: 1.2 x10 3/mm — ABNORMAL HIGH (ref 0.2–0.9)
Neutrophil #: 9.3 x10 3/mm — ABNORMAL HIGH (ref 1.4–6.5)
Neutrophil %: 79.5 %
PLATELETS: 27 x10 3/mm — AB (ref 150–440)
RBC: 2.69 10*6/uL — AB (ref 3.80–5.20)
RDW: 19.3 % — ABNORMAL HIGH (ref 11.5–14.5)
WBC: 11.8 x10 3/mm — ABNORMAL HIGH (ref 3.6–11.0)

## 2014-12-11 LAB — BASIC METABOLIC PANEL
ANION GAP: 14 (ref 7–16)
BUN: 14 mg/dL
CALCIUM: 8.9 mg/dL
CO2: 23 mmol/L
Chloride: 102 mmol/L
Creatinine: 0.76 mg/dL
EGFR (African American): >60
EGFR (Non-African Amer.): >60
Glucose: 133 mg/dL — ABNORMAL HIGH
Potassium: 3.5 mmol/L
SODIUM: 139 mmol/L

## 2014-12-14 NOTE — Consult Note (Signed)
PATIENT NAME:  Nancy Doyle, Nancy Doyle MR#:  [id number] DATE OF BIRTH:  Oct 15, 1934  DATE OF CONSULTATION:  12/06/2014  REFERRING PHYSICIAN:  Theodoro Grist, MD CONSULTING PHYSICIAN:  Pualani Borah R. Ma Hillock, MD  ONCOLOGY CONSULTATION    REASON FOR CONSULTATION: Stage IV lung cancer, on chemotherapy.   HISTORY OF PRESENT ILLNESS: The patient is 80 year old female with known history of stage IV metastatic small cell lung cancer who recently completed cycle 7 chemotherapy with carboplatin and etoposide. The patient does have intermittent fatigue and weakness from chemotherapy. She was admitted to the hospital yesterday with complaints of decreased oral intake for both food and fluids, Feeling weak and dizzy and dehydrated. She has been losing weight. Appetite has been poor. She felt like almost passing out and is not ambulating much. She has been given IV fluids. Hemoglobin was 6.6, platelet count 11,000, and she has received transfusion support. Today, she states she feels much better, beginning to eat better, but appetite is still poor. No fevers.   PAST MEDICAL HISTORY: As in HPI. In addition, chronic back pain, hypertension, upper GI bleed history, hyperlipidemia, osteoporosis, glaucoma, cataract extraction, anxiety disorder, diverticulosis.   FAMILY HISTORY: Noncontributory.   SOCIAL HISTORY: Quit smoking October 2015. Denies alcohol usage. Lives with family.   MEDICATION LIST: Norco 325/10 one tablet q. 6 hours p.r.n., alprazolam 0.5 mg b.i.d. p.r.n., amlodipine 10 mg daily, calcium and D 1 tablet daily, HCTZ 0.5 mg daily, multivitamin 1 daily, Zofran p.r.n., pantoprazole 40 mg b.i.d.   ALLERGIES: INCLUDE PENICILLIN.   REVIEW OF SYSTEMS:   CONSTITUTIONAL: As in HPI. Currently, no fevers or chills.   HEENT: Currently denies headaches or dizziness at rest. No epistaxis, ear or jaw pain.   CARDIAC: No angina, palpitation, or orthopnea.  LUNGS: Has a chronic cough and dyspnea on exertion from lung  cancer. No hemoptysis.  GASTROINTESTINAL: No nausea, vomiting, or diarrhea. No blood in stools.  GENITOURINARY: No dysuria or hematuria.  MUSCULOSKELETAL: Back pain is about the same, under control today.  SKIN: No new rashes or pruritus.  NEUROLOGIC: No new focal weakness, seizures.  ENDOCRINE: No polyuria or polydipsia.   PHYSICAL EXAMINATION:  GENERAL: The patient is weak but otherwise alert and oriented and converses appropriately. No acute distress. No icterus. Pallor present.  VITAL SIGNS: 98.4, 80, 18, 110/66, 94% on room air.  HEENT: Normocephalic, atraumatic. Extraocular movements intact. Sclerae anicteric.  NECK: Negative for lymphadenopathy.  CARDIOVASCULAR: Regular rate and rhythm.  LUNGS: Show bilateral diminished breath sounds. No crepitations.  ABDOMEN: Soft, nontender.  EXTREMITIES: Shows no edema or cyanosis.  NEUROLOGIC: Grossly nonfocal, cranial nerves intact.   LABORATORY RESULTS: Hemoglobin 7.0, platelets 30,000, WBC 4100, ANC 1000. Creatinine 0.64. Calcium 8.4. LFT unremarkable. Albumin 3.8.   IMPRESSION AND RECOMMENDATIONS: A 80 year old female patient with history of stage IV small cell lung cancer, status post cycle 7 chemotherapy with carboplatin and VP-16, has been admitted with side effects from chemotherapy causing decreased appetite, poor oral intake, and dehydration. The patient today is feeling much better and is trying to eat. Agree with trying appetite stimulant given poor appetite, recommend trying Megace liquid 400 mg daily and until it starts working, recommend getting short course of prednisone taper. The patient feels that she could go home today and would be okay. She was advised to keep outpatient appointments at [facility] the same. She does not have any acute pain issues at this time. She was encouraged to eat and also maintain increased oral fluid intake and otherwise  come back to Emergency Room if she has recurrent symptoms of sickness. She is  agreeable to this plan.   Thank you for the referral, please feel free to contact me if additional questions.    ____________________________ Rhett Bannister Ma Hillock, MD srp:bm D: 12/06/2014 22:12:53 ET T: 12/06/2014 22:42:52 ET JOB#: [id number]  cc: Trevor Iha R. Ma Hillock, MD, <Dictator> Alveta Heimlich MD ELECTRONICALLY SIGNED 12/07/2014 17:48

## 2014-12-14 NOTE — H&P (Addendum)
PATIENT NAME:  Nancy Doyle, Nancy Doyle MR#:  [id number] DATE OF BIRTH:  12/18/1934  DATE OF ADMISSION:  12/05/2014  PRIMARY CARE PHYSICIAN: Sandeep R. Ma Hillock, MD    HISTORY OF PRESENT ILLNESS: The patient is a 80 year old Caucasian female with past medical history significant for history of diagnosis of metastatic stage IV small cell right lung cancer with metastasis to the mediastinum, liver, parasternal T9 area. Also multiple other medical problems including hypertension, chronic back pain, generalized anxiety disorder, hyperlipidemia, who presents to the hospital with complaints of weakness. Apparently, the patient had a chemotherapy session a week ago and since that session she has been having problems with appetite. She is not eating or drinking well. She is not nauseated, not vomiting necessarily but she is just not taking good p.o. and has no appetite. She is also losing weight. She tells me that she lost approximately 6 or so pounds since a few months ago. She has been also having significant lightheadedness and dizziness and feeling presyncopal when she stands up. She feels very weak ambulating. She admits having some palpitations in the chest, as well as some blurring of vision. Because of that fatigue and weakness, she decided to come in to the Emergency Room for further evaluation. In the Emergency Room, she was noted to be pancytopenic with hemoglobin level of only 6.6, and platelet count of only 11. She denies any bleeding. Hospital services were contacted for admission.   PAST MEDICAL HISTORY: Significant for history of metastatic stage IV small cell right lung cancer and metastasis to mediastinum,  liver paraspinal T9 area. She presented, however, with multiple liver lesions on CT scan of abdomen and pelvis in October 2015. She has history of smoking in the past; however, quit in October 2015. History of hypertension, chronic back pain, generalized anxiety disorder, diverticulosis, history of upper  GI bleed, hyperlipidemia, osteoporosis, glaucoma, cataract extraction, history of melanoma at the age of 35.   FAMILY HISTORY: No malignancy.   SOCIAL HISTORY: He quit smoking in October 2015. He has a 70 pack-year history. No alcohol abuse. She was fairly active up until the diagnosis of cancer.   MEDICATIONS: The patient medication list is as follows: Acetaminophen hydrocodone 325/10 mg 1 tablet every 6 hours as needed, alprazolam 0.5 mg twice daily as needed, amlodipine 10 mg p.o. daily as needed, calcium and vitamin D 1 tablet once daily, hydrochlorothiazide 12.5 mg daily, multivitamins with minerals once daily, Zofran 4 mg 3 times daily as needed, pantoprazole 40 mg p.o. twice daily, travoprost ophthalmic solution 0.004% one drop to each affected eye at bedtime.   REVIEW OF SYSTEMS:  CONSTITUTIONAL: Positive for fatigue and weakness, weight loss, blurry vision, glaucoma, arrhythmias earlier today, feeling presyncopal, poor p.o. intake, history of pneumonia a couple of weeks ago, allergies for which she takes allergy pills. Denies any high fevers or chills, pains, weight loss or gain.  EYES: Denies any double vision or cataracts.  ENT: Denies any tinnitus, allergies, epistaxis, sinus pain, dentures or difficulty swallowing.  RESPIRATORY: Denies any cough, wheezes, asthma, COPD.   CARDIOVASCULAR: Denies chest pain symptoms, arrhythmias or palpitations.  GASTROINTESTINAL: Denies any nausea, vomiting. No hematemesis, rectal bleeding, change in bowel habits.  GENITOURINARY: Denies dysuria, hematuria, frequency, incontinence.  ENDOCRINE: Denies any polydipsia, nocturia, thyroid problems, heat or cold intolerance or thirst.  HEMATOLOGIC: Denies anemia, easy bruisability or swollen glands.  SKIN: Denies acne, rashes, lesions or change in moles.  MUSCULOSKELETAL: Denies arthritis, cramps, swelling.  NEUROLOGIC: Denies numbness, epilepsy or  tremors. PSYCHIATRY: Denies anxiety, insomnia or  depression.   PHYSICAL EXAMINATION: VITAL SIGNS: On arrival to the hospital, temperature was 97.6, pulse was 91, respiration was 18, blood pressure 117/54, saturation 94% on room air.  GENERAL: This is a well-developed, well-nourished, pale Caucasian female in no significant distress, lying on the stretcher.  HEENT: Her pupils are equal and reactive to light. Extraocular movements intact. No icterus or conjunctivitis. Has normal hearing. No pharyngeal erythema. Mucosa is very dry.   NECK: No masses. Supple, nontender. Thyroid was not enlarged. No adenopathy. No JVD or carotid bruits bilaterally. Full range of motion.  LUNGS: Clear to auscultation anteriorly. No rales, rhonchi or wheezing. No labored inspirations, increased effort, dullness to percussion. Not in overt respiratory distress.  CARDIOVASCULAR: S1, S2 appreciated. Rhythm is regular.No murmurs, gallps or rubs. Chest is nontender to palpation; 1+ pedal pulses.  EXTREMITIES: Mild trace of edema if at all was noted more in the her left lower extremity than the right lower extremity, but no calf tenderness or cyanosis were noted.  ABDOMEN: Soft, nontender. Bowel sounds are present. No  hepatosplenomegaly or masses were noted.  RECTAL: Deferred.  MUSCLE STRENGTH: Able to move all extremities. No cyanosis, degenerative joint disease or kyphosis. Gait not tested.  SKIN: Did not reveal any rashes, lesions, erythema, nodularity, or induration. It was warm and dry to palpation. No adenopathy in the cervical region.  NEUROLOGIC: Cranial nerves grossly intact. Sensory is intact. No dysarthria or aphasia. The patient is alert, oriented to time, person and place, cooperative. Memory is good. No significant confusion, agitation or depression.   LABORATORY DATA: Glucose of 112, potassium 3.2, calcium 8.5. Liver enzymes were unremarkable. BMP also was unremarkable otherwise. Troponin less than 0.03. White blood cell count is low at 3.5, hemoglobin was 6.6,  and platelet count was 11,000. MCV high at 101. Absolute neutrophil count is 1.5. The patient's urinalysis was unremarkable.   RADIOLOGIC STUDIES: Chest x-ray PA and lateral 12/05/2014 showed no radiographic evidence of acute cardiopulmonary disease with resolved pleural effusion from x-ray on 06/29/2014. 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 CT scan of head without contrast on 11/15/2014 showed stable exam. No interval change. No visible intracranial mass lesions.   ASSESSMENT AND PLAN: 1.  Pancytopenia. Admit the patient to the medical floor, transfuse her with packed red blood cells as well as platelets of 1 unit. We will get Dr. Ma Hillock to see the patient in consultation. We will also get guaiac of stool.  2.  Hyperglycemia. Check hemoglobin A1c.  3.  Hypokalemia. Supplement IV and check magnesium level.  4.  Presyncope. Suspect orthostatic hypotension. Get orthostatic vital signs. Continue the patient on IV fluids and transfuse her. Check orthostatic vital signs as per transfusion.  5.  Dehydration due to poor p.o. intake, failure to thrive adult. We will get dietary involved and we will continue the patient on diet following her oral intake. Will also add Marinol to see if that would improve her oral intake.   TIME SPENT: 50 minutes.    ____________________________ Theodoro Grist, MD rv:at D: 12/05/2014 16:50:46 ET T: 12/05/2014 17:35:15 ET JOB#: [id number]  cc: Theodoro Grist, MD, <Dictator> Sandeep R. Ma Hillock, MD Theodoro Grist MD ELECTRONICALLY SIGNED 12/12/2014 19:09

## 2014-12-14 NOTE — Discharge Summary (Signed)
PATIENT NAME:  Nancy Doyle, Nancy Doyle MR#:  [id number] DATE OF BIRTH:  07-09-1935  DATE OF ADMISSION:  12/05/2014 DATE OF DISCHARGE:  12/06/2014  ADMITTING COMPLAINT: Weakness.   DISCHARGE DIAGNOSES: 1.  Dehydration.  2.  Pancytopenia due to chemotherapy with Taxol.  3.  Stage IV small lung cancer with metastasis to the mediastinum and liver.  4.  Chronic back pain.  5.  General anxiety disorder.  6.  Hyperlipidemia.  7.  Glaucoma.  8.  Osteoporosis.  9.  History of gastrointestinal bleed.  10.   Diverticulosis.  11.   History of melanoma.    CONSULTATIONS: Sandeep R. Ma Hillock, MD   PROCEDURES: 1.  CT of the head without contrast shows stable examination with no interval change. No visible intracranial lesions.  2.  Chest x-ray shows no radiographic evidence of acute cardiopulmonary disease with resolved pleural effusion from the x-ray in November 2015. 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 Also shows osteopenia, T11 compression fracture which is unchanged.   HISTORY OF PRESENT ILLNESS: For further details of the presentation, please see the H and P dictated by Dr. Ether Griffins on April 22. In short, this 80 year old Caucasian female with past medical history significant for metastatic small cell lung cancer presents with weakness. She reports that she has had decreased appetite and very little p.o. intake. She has been losing weight. She has been lightheaded and dizzy with standing and weak with ambulation. On presentation to Emergency Room, she was found to be pancytopenic with a hemoglobin of 6.6 and platelets of 11,000.   HOSPITAL COURSE BY PROBLEM:  1.  Dehydration, likely due to decreased p.o. intake: On presentation, her orthostatics were positive and renal function was normal. She was given IV hydration during hospitalization and  appetite and energy level did improve.  2.  Pancytopenia due to Taxol: She was seen by Dr. Ma Hillock who confirmed that this is not an unexpected result with the 6th or 7th administration of this chemotherapy agent. She did receive 1 unit of platelets which brought her platelet count from 11,000 to 30,000. There was no bleeding noted. She received 1 unit of packed red blood cells bringing her hemoglobin from 6.6 to 7. She is not neutropenic. She will follow up with oncology.  3.  Hyperglycemia: She was hyperglycemic on presentation. Her hemoglobin A1c is 6.0. She is not a diabetic.  4.  Hypokalemia: Her potassium was low during the hospitalization likely due to decreased p.o. intake. This was replaced prior to discharge as was her magnesium. This will need to be followed in the outpatient setting.  5.  Weakness: Physical therapy was consulted for this patient and she does not need further physical therapy or home health.  6.  Decreased appetite: Marinol was started in the hospital, which worked well to stimulate her appetite. Unfortunately, her insurance will not cover this in the outpatient setting so she was discharged on prednisone and Megace.   DISCHARGE PHYSICAL EXAMINATION: VITAL SIGNS: Temperature 98.1, pulse 83, respirations 19, blood pressure 108/61, negative orthostatics, oxygenation 96% on room air.  GENERAL: The patient is very thin and pale, frail-appearing.  CARDIOVASCULAR: Regular rate and rhythm. No murmurs, rubs, or gallops. No peripheral edema.  RESPIRATORY: Lungs are clear to auscultation bilaterally with good air movement. No wheezes, rhonchi or rales.  ABDOMEN: Soft, nontender, nondistended.  No guarding or rebound.  PSYCHIATRIC: She is alert and oriented with good insight into her clinical condition   LABORATORY DATA: Sodium 138, potassium 3.1 (this was repleted prior to discharge), chloride 103, bicarbonate 26, BUN 13, creatinine 0.64, glucose 96, hemoglobin A1c 6.0. LFTs were  normal. Troponin less than 0.03 x 3. White blood cells 4.1, hemoglobin 7.0, platelets 30,000, MCV 96.   DISCHARGE MEDICATIONS: 1.  Alprazolam 0.5 mg 1 tablet twice a day as needed for anxiety.  2.  Amlodipine 10 mg 1 tablet once a day if systolic blood pressure is over 140, otherwise hold this medication.  3.  Pantoprazole 40 mg 1 tablet 2 times a day.  4.  Calcium 600 mg plus vitamin D 1 tablet once a day.  5.  Acetaminophen hydrocodone 325 mg/10 mg 1 tablet every 6 hours as needed for pain.  6.  Multivitamin 1 tablet daily in the morning.  7.  Ondansetron 4 mg 1 tablet 3 times a day as needed for nausea and vomiting.  8.  Travoprost ophthalmic 0.004% one drop to each affected eye once at a time.  9.  Ensure Enlive nutritional supplement 237 mL twice a day.  10.   Prednisone 5 mg tablets starting at a dose of 30 mg daily, decreasing by 5 mg per day and then stop.  11.   Megace extended release 625 mg per 5 mL suspension 3 mL once a day to stimulate appetite.   DISPOSITION: She is being discharged to home with no further home health needs.   DISCHARGE INSTRUCTIONS:  DIET: Regular.   DIETARY SUPPLEMENTS: Ensure as above.   ACTIVITY LIMITATIONS: None as tolerated.   TIMEFRAME FOR FOLLOWUP: Please follow up in 1 to 2 weeks with Dr. Ma Hillock and 1 to 2 to weeks with your primary care physician.   TIME SPENT ON DISCHARGE: 40 minutes.    ____________________________ Earleen Newport. Volanda Napoleon, MD cpw:at D: 12/09/2014 21:06:01 ET T: 12/10/2014 09:35:57 ET JOB#: [id number]  cc: Barnetta Chapel P. Volanda Napoleon, MD, <Dictator> Sandeep R. Ma Hillock, MD Aldean Jewett MD ELECTRONICALLY SIGNED 12/11/2014 7:34

## 2014-12-15 ENCOUNTER — Telehealth: Payer: Self-pay | Admitting: *Deleted

## 2014-12-15 MED ORDER — HYDROCODONE-ACETAMINOPHEN 10-325 MG PO TABS: 1.0000 | ORAL_TABLET | Freq: Four times a day (QID) | ORAL | Status: DC | PRN

## 2014-12-15 NOTE — Telephone Encounter (Signed)
Spoke with pt, scheduled f/u appoint.

## 2014-12-15 NOTE — Telephone Encounter (Signed)
Pt called requesting Hydrocodone refill last OV 12.16.15 last refill 2.8.16.  Please advise refill

## 2014-12-15 NOTE — Telephone Encounter (Signed)
Refill one 30 days only, rx printed.  Needs 6 month follow up prior to any  more refills

## 2014-12-18 ENCOUNTER — Encounter: Payer: Self-pay | Admitting: Internal Medicine

## 2014-12-18 ENCOUNTER — Ambulatory Visit (INDEPENDENT_AMBULATORY_CARE_PROVIDER_SITE_OTHER): Payer: Medicare Other | Admitting: Internal Medicine

## 2014-12-18 VITALS — BP 96/50 | HR 85 | Temp 97.7°F | Resp 12 | Ht 65.0 in | Wt 124.2 lb

## 2014-12-18 DIAGNOSIS — G8929 Other chronic pain: Secondary | ICD-10-CM

## 2014-12-18 DIAGNOSIS — C3491 Malignant neoplasm of unspecified part of right bronchus or lung: Secondary | ICD-10-CM | POA: Diagnosis not present

## 2014-12-18 DIAGNOSIS — IMO0001 Reserved for inherently not codable concepts without codable children: Secondary | ICD-10-CM

## 2014-12-18 DIAGNOSIS — R634 Abnormal weight loss: Secondary | ICD-10-CM

## 2014-12-18 DIAGNOSIS — M549 Dorsalgia, unspecified: Secondary | ICD-10-CM

## 2014-12-18 DIAGNOSIS — I951 Orthostatic hypotension: Secondary | ICD-10-CM | POA: Diagnosis not present

## 2014-12-18 MED ORDER — AMLODIPINE BESYLATE 5 MG PO TABS
5.0000 mg | ORAL_TABLET | Freq: Every day | ORAL | Status: DC
Start: 2014-12-18 — End: 2015-03-19

## 2014-12-18 MED ORDER — ALBUTEROL SULFATE HFA 108 (90 BASE) MCG/ACT IN AERS: 2.0000 | INHALATION_SPRAY | Freq: Four times a day (QID) | RESPIRATORY_TRACT | Status: DC | PRN

## 2014-12-18 MED ORDER — MIRTAZAPINE 15 MG PO TABS: 15.0000 mg | ORAL_TABLET | Freq: Every day | ORAL | Status: DC

## 2014-12-18 NOTE — Patient Instructions (Addendum)
Your blood levesl have improved slightly since your transfusions,  But keep you lab appointment at the [facility].   Stop your amlodipine for a few days because you BP is too low.   Resume  The amlodipine at 5 mg (1/2 tablet) if your BP is > 150 on Sunday   I am starting you on mirtazapine  To take in the evening before bedtime.  It is  for your appetite and energy .  Start with 1/2 tablet for the first week ,  Then increase to full tablet

## 2014-12-18 NOTE — Progress Notes (Signed)
Patient ID: Nancy Doyle, female   DOB: 08/24/1934, 80 y.o.   MRN: [id number]  Patient Active Problem List   Diagnosis Date Noted  . Hypotension 12/20/2014  . Loss of weight 12/20/2014  . T11 vertebral fracture 08/02/2014  . Insomnia due to anxiety and fear 08/02/2014  . SCCA (small cell carcinoma of lung) 06/05/2014  . Visit for preventive health examination 09/07/2013  . Glaucoma 09/05/2013  . Cataract extraction status 09/05/2013  . Chronic back pain greater than 3 months duration 05/06/2013  . Tobacco abuse 04/29/2011  . Tobacco abuse counseling 04/29/2011  . Generalized anxiety disorder 04/29/2011  . Glaucoma 04/29/2011  . Other and unspecified hyperlipidemia 04/28/2011  . Essential hypertension, benign 04/28/2011  . Diverticulosis 04/28/2011  . Upper GI bleed 04/28/2011  . Osteoporosis 04/28/2011    Subjective:  CC:   Chief Complaint  Patient presents with  . Follow-up    6 month,    HPI:   Nancy Doyle is a 80 y.o. female who presents for  6 month follow up on insomnia, hypertension, osteoporosis with prior fracture and chronic back pain.  Recent diagnosis of Lung CA.  Hospitalized recently overnight  by Oncology for weakness , 4/22, attributed to dehydration  and severe anemia. . Was transfused  for hgb 6.6 and platelets of 11K .  Labs were repeated on April 28th and had improved somewhat.  Last chemo was April 13th and has been suspended until her counts recover. Has next lab appt  on  May 12,  CT scan to be done June 28  Sees pandit June 30th   Last night: had pain in chest with deep breath, felt like tightness, gone today,  Does not use inhalers.  Has been breaking out in a sweat at night  For the past few days   Weight loss secondary to anorexia, .  Was prescribed Megace but the cost was $200.00 so she could not afford it. Discussed trial of remeron.    Hypertension,  Was hypotensive Has been taking amlodipine for bp > 140 and stopped hctz.  Took it  this am for home reading of 140   Past Medical History  Diagnosis Date  . GI bleed Nov 2009    3  ulcers found, required transfusion, repeat endoscopy showed mild gastritis  . Hypercholesterolemia     refuses statin therapy  . Diverticulitis 2005    hospitalized  . Hypertension   . Osteoporosis   . Tobacco abuse   . Tobacco abuse counseling     Past Surgical History  Procedure Laterality Date  . Incontinence surgery  2003    Dr. Davis Gourd  . Appendectomy  Age 11  . Cataract extraction  Jan 2011    Dr. Wallace Going       The following portions of the patient's history were reviewed and updated as appropriate: Allergies, current medications, and problem list.    Review of Systems:   Patient denies headache, fevers, malaise, unintentional weight loss, skin rash, eye pain, sinus congestion and sinus pain, sore throat, dysphagia,  hemoptysis , cough, dyspnea, wheezing, chest pain, palpitations, orthopnea, edema, abdominal pain, nausea, melena, diarrhea, constipation, flank pain, dysuria, hematuria, urinary  Frequency, nocturia, numbness, tingling, seizures,  Focal weakness, Loss of consciousness,  Tremor, insomnia, depression, anxiety, and suicidal ideation.     History   Social History  . Marital Status: Divorced    Spouse Name: N/A  . Number of Children: N/A  . Years of Education: N/A  Occupational History  . Retired    Social History Main Topics  . Smoking status: Current Every Day Smoker -- 0.50 packs/day for 15 years    Types: Cigarettes  . Smokeless tobacco: Never Used  . Alcohol Use: No  . Drug Use: No  . Sexual Activity: Not Currently   Other Topics Concern  . Not on file   Social History Narrative    Objective:  Filed Vitals:   12/18/14 1032  BP: 96/50  Pulse: 85  Temp: 97.7 F (36.5 C)  Resp: 12     General appearance: alert, cooperative and appears stated age Ears: normal TM's and external ear canals both ears Throat: lips, mucosa,  and tongue normal; teeth and gums normal Neck: no adenopathy, no carotid bruit, supple, symmetrical, trachea midline and thyroid not enlarged, symmetric, no tenderness/mass/nodules Back: symmetric, no curvature. ROM normal. No CVA tenderness. Lungs: clear to auscultation bilaterally Heart: regular rate and rhythm, S1, S2 normal, no murmur, click, rub or gallop Abdomen: soft, non-tender; bowel sounds normal; no masses,  no organomegaly Pulses: 2+ and symmetric Skin: Skin color, texture, turgor normal. No rashes or lesions Lymph nodes: Cervical, supraclavicular, and axillary nodes normal.  Assessment and Plan:  Hypotension Secondary to weight loss and relative dehydration,  hctz has already been suspended,  stopppng amlodipine and resuming at 5 mg for sbp > 150   SCCA (small cell carcinoma of lung) Chemotherapy has been suspended due to low blood counts,  Follow up with Pandit   Chronic back pain greater than 3 months duration Secondary to vertebral fractures due to osteoporosis.  Will refill vicodin as needed. Patient has concurrent diagnosis of small cell lung Ca and prognosis is poor.   Loss of weight Secondary to effects of chemo for small call lung CA treatment, accompanied by insomnia and depression. Trial of remeron recommended starting at 7.5 mg daily     Updated Medication List Outpatient Encounter Prescriptions as of 12/18/2014  Medication Sig  . ALPRAZolam (XANAX) 0.5 MG tablet Take 1 tablet (0.5 mg total) by mouth 2 (two) times daily as needed.  Marland Kitchen amLODipine (NORVASC) 5 MG tablet Take 1 tablet (5 mg total) by mouth daily. As needed for BP > 150  . Calcium Carbonate-Vitamin D (CALCIUM + D) 600-200 MG-UNIT TABS Take 1 tablet by mouth daily.    Marland Kitchen HYDROcodone-acetaminophen (NORCO) 10-325 MG per tablet Take 1 tablet by mouth every 6 (six) hours as needed.  . latanoprost (XALATAN) 0.005 % ophthalmic solution Place 1 drop into both eyes at bedtime.  . metoCLOPramide (REGLAN) 10  MG tablet Take 10 mg by mouth 4 (four) times daily.  . multivitamin-iron-minerals-folic acid (CENTRUM) chewable tablet Chew 1 tablet by mouth daily.    . ondansetron (ZOFRAN) 4 MG tablet Take 1 tablet (4 mg total) by mouth 3 (three) times daily as needed for nausea or vomiting.  . pantoprazole (PROTONIX) 40 MG tablet Take 1 tablet (40 mg total) by mouth 2 (two) times daily.  . potassium chloride SA (K-DUR,KLOR-CON) 20 MEQ tablet Take 20 mEq by mouth 2 (two) times daily.  Marland Kitchen senna-docusate (SENOKOT-S) 8.6-50 MG per tablet Take 1 tablet by mouth at bedtime.  Marland Kitchen venlafaxine XR (EFFEXOR-XR) 37.5 MG 24 hr capsule Take 37.5 mg by mouth daily.  . [DISCONTINUED] amLODipine (NORVASC) 10 MG tablet TAKE 1 TABLET (10 MG TOTAL) BY MOUTH DAILY.  Marland Kitchen albuterol (PROVENTIL HFA;VENTOLIN HFA) 108 (90 BASE) MCG/ACT inhaler Inhale 2 puffs into the lungs every 6 (six) hours as  needed for wheezing or shortness of breath.  . levofloxacin (LEVAQUIN) 500 MG tablet Take 500 mg by mouth daily.  . mirtazapine (REMERON) 15 MG tablet Take 1 tablet (15 mg total) by mouth at bedtime.  . [DISCONTINUED] ciprofloxacin (CIPRO) 500 MG tablet Take 1 tablet (500 mg total) by mouth 2 (two) times daily. (Patient not taking: Reported on 07/30/2014)  . [DISCONTINUED] diazepam (VALIUM) 5 MG tablet Take 1 tablet (5 mg total) by mouth every 12 (twelve) hours as needed for anxiety. (Patient not taking: Reported on 07/30/2014)  . [DISCONTINUED] hydrochlorothiazide (HYDRODIURIL) 12.5 MG tablet Take 1 tablet (12.5 mg total) by mouth daily.  . [DISCONTINUED] hydrochlorothiazide (HYDRODIURIL) 12.5 MG tablet Take 1 tablet (12.5 mg total) by mouth daily. (Patient not taking: Reported on 12/18/2014)  . [DISCONTINUED] travoprost, benzalkonium, (TRAVATAN) 0.004 % ophthalmic solution Place 1 drop into both eyes at bedtime.    . [DISCONTINUED] zolpidem (AMBIEN CR) 6.25 MG CR tablet Take 1 tablet (6.25 mg total) by mouth at bedtime as needed for sleep. (Patient not  taking: Reported on 12/18/2014)   No facility-administered encounter medications on file as of 12/18/2014.     No orders of the defined types were placed in this encounter.    Return in about 6 months (around 06/20/2015).

## 2014-12-18 NOTE — Progress Notes (Signed)
Pre-visit discussion using our clinic review tool. No additional management support is needed unless otherwise documented below in the visit note.  

## 2014-12-20 ENCOUNTER — Other Ambulatory Visit: Payer: Self-pay | Admitting: *Deleted

## 2014-12-20 ENCOUNTER — Encounter: Payer: Self-pay | Admitting: Internal Medicine

## 2014-12-20 DIAGNOSIS — R634 Abnormal weight loss: Secondary | ICD-10-CM | POA: Insufficient documentation

## 2014-12-20 DIAGNOSIS — I10 Essential (primary) hypertension: Secondary | ICD-10-CM | POA: Insufficient documentation

## 2014-12-20 DIAGNOSIS — C787 Secondary malignant neoplasm of liver and intrahepatic bile duct: Secondary | ICD-10-CM

## 2014-12-20 DIAGNOSIS — C7949 Secondary malignant neoplasm of other parts of nervous system: Secondary | ICD-10-CM

## 2014-12-20 NOTE — Assessment & Plan Note (Signed)
Secondary to effects of chemo for small call lung CA treatment, accompanied by insomnia and depression. Trial of remeron recommended starting at 7.5 mg daily

## 2014-12-20 NOTE — Assessment & Plan Note (Signed)
Chemotherapy has been suspended due to low blood counts,  Follow up with Ma Hillock

## 2014-12-20 NOTE — Assessment & Plan Note (Signed)
Secondary to weight loss and relative dehydration,  hctz has already been suspended,  stopppng amlodipine and resuming at 5 mg for sbp > 150

## 2014-12-20 NOTE — Assessment & Plan Note (Signed)
Secondary to vertebral fractures due to osteoporosis.  Will refill vicodin as needed. Patient has concurrent diagnosis of small cell lung Ca and prognosis is poor.

## 2014-12-22 ENCOUNTER — Other Ambulatory Visit: Payer: Self-pay | Admitting: *Deleted

## 2014-12-22 DIAGNOSIS — C7951 Secondary malignant neoplasm of bone: Secondary | ICD-10-CM

## 2014-12-22 DIAGNOSIS — C3491 Malignant neoplasm of unspecified part of right bronchus or lung: Secondary | ICD-10-CM

## 2014-12-22 DIAGNOSIS — C787 Secondary malignant neoplasm of liver and intrahepatic bile duct: Secondary | ICD-10-CM

## 2014-12-25 ENCOUNTER — Inpatient Hospital Stay: Payer: Medicare Other | Attending: Internal Medicine

## 2014-12-25 DIAGNOSIS — C7949 Secondary malignant neoplasm of other parts of nervous system: Secondary | ICD-10-CM

## 2014-12-25 DIAGNOSIS — C3491 Malignant neoplasm of unspecified part of right bronchus or lung: Secondary | ICD-10-CM | POA: Diagnosis not present

## 2014-12-25 DIAGNOSIS — C787 Secondary malignant neoplasm of liver and intrahepatic bile duct: Secondary | ICD-10-CM

## 2014-12-25 LAB — CBC WITH DIFFERENTIAL/PLATELET
BASOS ABS: 0 10*3/uL (ref 0–0.1)
Basophils Relative: 0 %
Eosinophils Absolute: 0 10*3/uL (ref 0–0.7)
Eosinophils Relative: 1 %
HCT: 24.1 % — ABNORMAL LOW (ref 35.0–47.0)
Hemoglobin: 7.7 g/dL — ABNORMAL LOW (ref 12.0–16.0)
Lymphocytes Relative: 26 %
Lymphs Abs: 1.2 10*3/uL (ref 1.0–3.6)
MCH: 31.6 pg (ref 26.0–34.0)
MCHC: 32 g/dL (ref 32.0–36.0)
MCV: 98.8 fL (ref 80.0–100.0)
Monocytes Absolute: 1.3 10*3/uL — ABNORMAL HIGH (ref 0.2–0.9)
Monocytes Relative: 29 %
NEUTROS ABS: 2 10*3/uL (ref 1.4–6.5)
Platelets: 117 10*3/uL — ABNORMAL LOW (ref 150–440)
RBC: 2.45 MIL/uL — ABNORMAL LOW (ref 3.80–5.20)
RDW: 20.3 % — AB (ref 11.5–14.5)
WBC: 4.5 10*3/uL (ref 3.6–11.0)

## 2015-02-09 ENCOUNTER — Telehealth: Payer: Self-pay

## 2015-02-09 NOTE — Telephone Encounter (Signed)
Last OV note stated return in 11/16. For Tullo check again patient is on nurse visit.

## 2015-02-09 NOTE — Telephone Encounter (Signed)
This patient is scheduled for a bp check on Dr.Tullo's schedule.  Just clarifying if Dr.Tullo wants to have an ov with the pt, or if she needs a bp check on the nurse schedule. Thanks!

## 2015-02-10 ENCOUNTER — Other Ambulatory Visit: Payer: Self-pay | Admitting: *Deleted

## 2015-02-10 ENCOUNTER — Other Ambulatory Visit
Admission: RE | Admit: 2015-02-10 | Discharge: 2015-02-10 | Disposition: A | Discharge status: Home / Self Care | Payer: Medicare Other | Source: Ambulatory Visit | Attending: Internal Medicine | Admitting: Internal Medicine

## 2015-02-10 ENCOUNTER — Ambulatory Visit: Payer: Medicare Other | Admitting: *Deleted

## 2015-02-10 ENCOUNTER — Ambulatory Visit
Admission: RE | Admit: 2015-02-10 | Discharge: 2015-02-10 | Disposition: A | Discharge status: Home / Self Care | Payer: Medicare Other | Source: Ambulatory Visit | Attending: Internal Medicine | Admitting: Internal Medicine

## 2015-02-10 ENCOUNTER — Telehealth: Payer: Self-pay | Admitting: *Deleted

## 2015-02-10 VITALS — BP 138/69 | HR 74

## 2015-02-10 DIAGNOSIS — K8020 Calculus of gallbladder without cholecystitis without obstruction: Secondary | ICD-10-CM | POA: Insufficient documentation

## 2015-02-10 DIAGNOSIS — C3491 Malignant neoplasm of unspecified part of right bronchus or lung: Secondary | ICD-10-CM | POA: Insufficient documentation

## 2015-02-10 DIAGNOSIS — I10 Essential (primary) hypertension: Secondary | ICD-10-CM

## 2015-02-10 DIAGNOSIS — I2510 Atherosclerotic heart disease of native coronary artery without angina pectoris: Secondary | ICD-10-CM | POA: Diagnosis not present

## 2015-02-10 DIAGNOSIS — C787 Secondary malignant neoplasm of liver and intrahepatic bile duct: Secondary | ICD-10-CM

## 2015-02-10 DIAGNOSIS — C7951 Secondary malignant neoplasm of bone: Secondary | ICD-10-CM

## 2015-02-10 DIAGNOSIS — C781 Secondary malignant neoplasm of mediastinum: Secondary | ICD-10-CM | POA: Insufficient documentation

## 2015-02-10 DIAGNOSIS — C7952 Secondary malignant neoplasm of bone marrow: Secondary | ICD-10-CM

## 2015-02-10 HISTORY — DX: Malignant (primary) neoplasm, unspecified: C80.1

## 2015-02-10 MED ORDER — HYDROCODONE-ACETAMINOPHEN 10-325 MG PO TABS: 1.0000 | ORAL_TABLET | Freq: Four times a day (QID) | ORAL | Status: DC | PRN

## 2015-02-10 NOTE — Telephone Encounter (Signed)
Pt is here in the office for a blood pressure check and is requesting a refill on Norco.

## 2015-02-10 NOTE — Telephone Encounter (Signed)
Ok to refill,  printed rx  

## 2015-02-10 NOTE — Progress Notes (Addendum)
Pt came in today for a blood pressure check.  Blood pressure was taken with the nurse on a stick using the normal cuff, on the pt's left arm. Blood pressure was 138/69, pulse 74.     I have reviewed the above information and agree with above.   Deborra Medina, MD

## 2015-02-11 ENCOUNTER — Other Ambulatory Visit: Payer: Self-pay | Admitting: Internal Medicine

## 2015-02-12 ENCOUNTER — Inpatient Hospital Stay (HOSPITAL_BASED_OUTPATIENT_CLINIC_OR_DEPARTMENT_OTHER): Payer: Medicare Other | Admitting: Internal Medicine

## 2015-02-12 ENCOUNTER — Inpatient Hospital Stay: Payer: Medicare Other | Attending: Internal Medicine

## 2015-02-12 ENCOUNTER — Inpatient Hospital Stay: Payer: Medicare Other

## 2015-02-12 VITALS — BP 133/63 | HR 78 | Temp 97.6°F | Ht 65.0 in | Wt 135.8 lb

## 2015-02-12 DIAGNOSIS — Z418 Encounter for other procedures for purposes other than remedying health state: Secondary | ICD-10-CM

## 2015-02-12 DIAGNOSIS — R5383 Other fatigue: Secondary | ICD-10-CM | POA: Insufficient documentation

## 2015-02-12 DIAGNOSIS — E785 Hyperlipidemia, unspecified: Secondary | ICD-10-CM | POA: Diagnosis not present

## 2015-02-12 DIAGNOSIS — M549 Dorsalgia, unspecified: Secondary | ICD-10-CM | POA: Diagnosis not present

## 2015-02-12 DIAGNOSIS — K8020 Calculus of gallbladder without cholecystitis without obstruction: Secondary | ICD-10-CM | POA: Insufficient documentation

## 2015-02-12 DIAGNOSIS — C3491 Malignant neoplasm of unspecified part of right bronchus or lung: Secondary | ICD-10-CM | POA: Diagnosis not present

## 2015-02-12 DIAGNOSIS — Z9221 Personal history of antineoplastic chemotherapy: Secondary | ICD-10-CM

## 2015-02-12 DIAGNOSIS — I10 Essential (primary) hypertension: Secondary | ICD-10-CM | POA: Insufficient documentation

## 2015-02-12 DIAGNOSIS — K449 Diaphragmatic hernia without obstruction or gangrene: Secondary | ICD-10-CM

## 2015-02-12 DIAGNOSIS — D709 Neutropenia, unspecified: Secondary | ICD-10-CM

## 2015-02-12 DIAGNOSIS — F419 Anxiety disorder, unspecified: Secondary | ICD-10-CM

## 2015-02-12 DIAGNOSIS — I2510 Atherosclerotic heart disease of native coronary artery without angina pectoris: Secondary | ICD-10-CM

## 2015-02-12 DIAGNOSIS — C7951 Secondary malignant neoplasm of bone: Secondary | ICD-10-CM

## 2015-02-12 DIAGNOSIS — C781 Secondary malignant neoplasm of mediastinum: Secondary | ICD-10-CM | POA: Diagnosis not present

## 2015-02-12 DIAGNOSIS — Z8719 Personal history of other diseases of the digestive system: Secondary | ICD-10-CM | POA: Diagnosis not present

## 2015-02-12 DIAGNOSIS — F17210 Nicotine dependence, cigarettes, uncomplicated: Secondary | ICD-10-CM | POA: Insufficient documentation

## 2015-02-12 DIAGNOSIS — E780 Pure hypercholesterolemia: Secondary | ICD-10-CM | POA: Diagnosis not present

## 2015-02-12 DIAGNOSIS — Z79899 Other long term (current) drug therapy: Secondary | ICD-10-CM | POA: Diagnosis not present

## 2015-02-12 DIAGNOSIS — R0609 Other forms of dyspnea: Secondary | ICD-10-CM | POA: Diagnosis not present

## 2015-02-12 DIAGNOSIS — D696 Thrombocytopenia, unspecified: Secondary | ICD-10-CM

## 2015-02-12 DIAGNOSIS — C787 Secondary malignant neoplasm of liver and intrahepatic bile duct: Secondary | ICD-10-CM | POA: Diagnosis not present

## 2015-02-12 DIAGNOSIS — I77819 Aortic ectasia, unspecified site: Secondary | ICD-10-CM | POA: Diagnosis not present

## 2015-02-12 DIAGNOSIS — D649 Anemia, unspecified: Secondary | ICD-10-CM | POA: Insufficient documentation

## 2015-02-12 DIAGNOSIS — Z85820 Personal history of malignant melanoma of skin: Secondary | ICD-10-CM

## 2015-02-12 DIAGNOSIS — G8929 Other chronic pain: Secondary | ICD-10-CM

## 2015-02-12 DIAGNOSIS — M818 Other osteoporosis without current pathological fracture: Secondary | ICD-10-CM | POA: Insufficient documentation

## 2015-02-12 LAB — HEPATIC FUNCTION PANEL
ALT: 14 U/L (ref 14–54)
AST: 21 U/L (ref 15–41)
Albumin: 4 g/dL (ref 3.5–5.0)
Alkaline Phosphatase: 77 U/L (ref 38–126)
Bilirubin, Direct: <0.1 mg/dL — ABNORMAL LOW (ref 0.1–0.5)
TOTAL PROTEIN: 7.9 g/dL (ref 6.5–8.1)
Total Bilirubin: <0.1 mg/dL — ABNORMAL LOW (ref 0.3–1.2)

## 2015-02-12 LAB — CBC WITH DIFFERENTIAL/PLATELET
Basophils Absolute: 0 10*3/uL (ref 0–0.1)
Basophils Relative: 1 %
EOS ABS: 0 10*3/uL (ref 0–0.7)
Eosinophils Relative: 1 %
HCT: 32.5 % — ABNORMAL LOW (ref 35.0–47.0)
Hemoglobin: 10.3 g/dL — ABNORMAL LOW (ref 12.0–16.0)
Lymphocytes Relative: 58 %
Lymphs Abs: 1.3 10*3/uL (ref 1.0–3.6)
MCH: 30.1 pg (ref 26.0–34.0)
MCHC: 31.5 g/dL — ABNORMAL LOW (ref 32.0–36.0)
MCV: 95.4 fL (ref 80.0–100.0)
MONO ABS: 0.3 10*3/uL (ref 0.2–0.9)
Monocytes Relative: 15 %
NEUTROS ABS: 0.6 10*3/uL — AB (ref 1.4–6.5)
NEUTROS PCT: 25 %
Platelets: 118 10*3/uL — ABNORMAL LOW (ref 150–440)
RBC: 3.41 MIL/uL — ABNORMAL LOW (ref 3.80–5.20)
RDW: 17.7 % — AB (ref 11.5–14.5)
WBC: 2.2 10*3/uL — AB (ref 3.6–11.0)

## 2015-02-12 LAB — BASIC METABOLIC PANEL
Anion gap: 8 (ref 5–15)
BUN: 20 mg/dL (ref 6–20)
CALCIUM: 9.1 mg/dL (ref 8.9–10.3)
CO2: 26 mmol/L (ref 22–32)
Chloride: 104 mmol/L (ref 101–111)
Creatinine, Ser: 0.8 mg/dL (ref 0.44–1.00)
GFR calc Af Amer: >60 mL/min (ref >60)
GFR calc non Af Amer: >60 mL/min (ref >60)
Glucose, Bld: 96 mg/dL (ref 65–99)
Potassium: 4.2 mmol/L (ref 3.5–5.1)
SODIUM: 138 mmol/L (ref 135–145)

## 2015-02-12 MED ORDER — TBO-FILGRASTIM 300 MCG/0.5ML ~~LOC~~ SOSY: 300.0000 ug | PREFILLED_SYRINGE | Freq: Once | SUBCUTANEOUS | Status: DC

## 2015-02-12 MED ORDER — TBO-FILGRASTIM 300 MCG/0.5ML ~~LOC~~ SOSY
300.0000 ug | PREFILLED_SYRINGE | Freq: Once | SUBCUTANEOUS | Status: AC
  Administered 2015-02-12: 300 ug via SUBCUTANEOUS

## 2015-02-12 NOTE — Progress Notes (Signed)
Patient states that she has been doing well and offers no complaints today.

## 2015-02-18 ENCOUNTER — Telehealth: Payer: Self-pay | Admitting: Internal Medicine

## 2015-02-18 NOTE — Telephone Encounter (Signed)
Returned call to patient to clarify.  Patient dropped off a list of her recent BP last Thursday, (I can't seem to find it) Patient's BP prior to medications this morning was 154/82.  She states that she has been taking the Norvasc pretty regularly in the past week or so.  Please advise?

## 2015-02-18 NOTE — Telephone Encounter (Signed)
Spoke with the patient, patient verbalized understanding to restart HCTZ at dose that was prior prescribed in May.  She stated that she has the medication at home.

## 2015-02-18 NOTE — Telephone Encounter (Signed)
She should resume her hcta, which was stopped in May due to low BP

## 2015-02-18 NOTE — Telephone Encounter (Signed)
The patient would like a call back regarding her BP it has been running high.  154/82 this morning.

## 2015-02-20 DIAGNOSIS — H4011X1 Primary open-angle glaucoma, mild stage: Secondary | ICD-10-CM | POA: Diagnosis not present

## 2015-02-20 DIAGNOSIS — H59031 Cystoid macular edema following cataract surgery, right eye: Secondary | ICD-10-CM | POA: Diagnosis not present

## 2015-02-22 NOTE — Progress Notes (Signed)
Nancy Doyle  [phone] Fax:([phone]) [phone]     ID: LEEA RAMBEAU OB: 09-Aug-1935  MR#: [id number]  TKW#:[id number]  Patient Care Team: Crecencio Mc, MD as PCP - General (Internal Medicine)  CHIEF COMPLAINT/DIAGNOSIS:  Metastatic stage IV small cell right lung cancer, with metastases to mediastinum, liver, paraspinal T9 area. (presented with multiple liver lesions on CT scan of abdomen/pelvis on 06/02/14 which also reported wall thickening at the anorectal junction; wall thickening in the sigmoid colon; slight wall thickening in the stomach, several lesions in the upper spleen, as well as a nodule at the right lung base, paraspinal nodules on both sides of the T9 vertebral body could be from extra medullary hematopoiesis or malignancy, chronic compression fracture at L1 with chronic grade 1 retrolisthesis at L2-3, aortoiliac atherosclerosis with infrarenal abdominal aortic ectasia. 06/04/14 - serum CEA is 80.7, serum CA-125 is 78.  Started palliative chemotherapy with Carboplatin/Etoposide on 06/25/14. Got cycle 7 on April 11-13, 2016.    HISTORY OF PRESENT ILLNESS:  Patient returns for continued oncology followup, she has repeat CT scan done. Clinically states fatigue and dyspnea on exertion is same. Eating steady. ANC today is dropped to 600 despite being off of chemo for a while. No fever or chills. No new cough, chest pain, or hemoptysis. No new headaches, falls, or loss of consciousness.   REVIEW OF SYSTEMS:   ROS As in HPI above. In addition, no fever, chills or sweats. No new headaches or focal weakness.  No new mood disturbances. No sore throat or dysphagia. No dizziness or palpitation. No abdominal pain, constipation, diarrhea, dysuria or hematuria. No new skin rash or bleeding symptoms. No new paresthesias in extremities.    PAST MEDICAL HISTORY: Reviewed. Past Medical History  Diagnosis Date  . GI bleed Nov 2009    3  ulcers found, required  transfusion, repeat endoscopy showed mild gastritis  . Hypercholesterolemia     refuses statin therapy  . Diverticulitis 2005    hospitalized  . Hypertension   . Osteoporosis   . Tobacco abuse   . Tobacco abuse counseling   . Cancer           Hypertension  Chronic back pain  Generalized anxiety disorder  Diverticulosis  History of upper GI bleed  Hyperlipidemia  Osteoporosis  Glaucoma  Cataract extraction  History of melanoma at age 80  PAST SURGICAL HISTORY: Reviewed. Past Surgical History  Procedure Laterality Date  . Incontinence surgery  2003    Dr. Davis Gourd  . Appendectomy  Age 80  . Cataract extraction  Jan 2011    Dr. Wallace Going   FAMILY HISTORY: Reviewed. Noncontributory, denies malignancy.  SOCIAL HISTORY: Reviewed. History  Substance Use Topics  . Smoking status: Current Every Day Smoker -- 0.50 packs/day for 15 years    Types: Cigarettes  . Smokeless tobacco: Never Used  . Alcohol Use: No    Allergies  Allergen Reactions  . Penicillins Rash    Current Outpatient Prescriptions  Medication Sig Dispense Refill  . albuterol (PROVENTIL HFA;VENTOLIN HFA) 108 (90 BASE) MCG/ACT inhaler Inhale 2 puffs into the lungs every 6 (six) hours as needed for wheezing or shortness of breath. 1 Inhaler 11  . ALPRAZolam (XANAX) 0.5 MG tablet Take 1 tablet (0.5 mg total) by mouth 2 (two) times daily as needed. 60 tablet 3  . amLODipine (NORVASC) 5 MG tablet Take 1 tablet (5 mg total) by mouth daily. As needed for BP > 150 30 tablet 2  .  Calcium Carbonate-Vitamin D (CALCIUM + D) 600-200 MG-UNIT TABS Take 1 tablet by mouth daily.      Marland Kitchen HYDROcodone-acetaminophen (NORCO) 10-325 MG per tablet Take 1 tablet by mouth every 6 (six) hours as needed. 120 tablet 0  . latanoprost (XALATAN) 0.005 % ophthalmic solution Place 1 drop into both eyes at bedtime.    . metoCLOPramide (REGLAN) 10 MG tablet Take 10 mg by mouth 4 (four) times daily.    . mirtazapine (REMERON) 15 MG tablet  Take 1 tablet (15 mg total) by mouth at bedtime. 30 tablet 3  . multivitamin-iron-minerals-folic acid (CENTRUM) chewable tablet Chew 1 tablet by mouth daily.      . ondansetron (ZOFRAN) 4 MG tablet Take 1 tablet (4 mg total) by mouth 3 (three) times daily as needed for nausea or vomiting. 90 tablet 1  . pantoprazole (PROTONIX) 40 MG tablet Take 1 tablet (40 mg total) by mouth 2 (two) times daily. 30 tablet 3  . potassium chloride SA (K-DUR,KLOR-CON) 20 MEQ tablet Take 20 mEq by mouth 2 (two) times daily.    Marland Kitchen senna-docusate (SENOKOT-S) 8.6-50 MG per tablet Take 1 tablet by mouth at bedtime.    Marland Kitchen venlafaxine XR (EFFEXOR-XR) 37.5 MG 24 hr capsule Take 37.5 mg by mouth daily.  3  . amLODipine (NORVASC) 10 MG tablet TAKE 1 TABLET (10 MG TOTAL) BY MOUTH DAILY. (Patient not taking: Reported on 02/12/2015) 30 tablet 5  . levofloxacin (LEVAQUIN) 500 MG tablet Take 500 mg by mouth daily.     Current Facility-Administered Medications  Medication Dose Route Frequency Provider Last Rate Last Dose  . Tbo-Filgrastim (GRANIX) injection 300 mcg  300 mcg Subcutaneous Once Leia Alf, MD        PHYSICAL EXAM: Filed Vitals:   02/12/15 1005  BP: 133/63  Pulse: 78  Temp: 97.6 F (36.4 C)     Body mass index is 22.6 kg/(m^2).    ECOG FS:2 - Symptomatic, <50% confined to bed  GENERAL: Patient is alert and oriented and in no acute distress. There is no icterus. HEENT: EOMs intact. Oral exam negative for thrush or lesions. No cervical lymphadenopathy. CVS: S1S2, regular LUNGS: Bilaterally good air entry, no rhonchi. ABDOMEN: Soft, nontender.    NEURO: grossly nonfocal, cranial nerves are intact.   EXTREMITIES: No pedal edema.   LAB RESULTS:    Component Value Date/Time   NA 138 02/12/2015 0941   NA 139 12/11/2014 1018   NA 135* 06/30/2014   K 4.2 02/12/2015 0941   K 3.5 12/11/2014 1018   CL 104 02/12/2015 0941   CL 102 12/11/2014 1018   CO2 26 02/12/2015 0941   CO2 23 12/11/2014 1018   GLUCOSE  96 02/12/2015 0941   GLUCOSE 133* 12/11/2014 1018   BUN 20 02/12/2015 0941   BUN 14 12/11/2014 1018   BUN 15 06/30/2014   CREATININE 0.80 02/12/2015 0941   CREATININE 0.76 12/11/2014 1018   CREATININE 0.6 06/30/2014   CREATININE 0.66 11/29/2011 1043   CALCIUM 9.1 02/12/2015 0941   CALCIUM 8.9 12/11/2014 1018   PROT 7.9 02/12/2015 0941   PROT 7.8 12/11/2014 1018   ALBUMIN 4.0 02/12/2015 0941   ALBUMIN 4.4 12/11/2014 1018   AST 21 02/12/2015 0941   AST 20 12/11/2014 1018   ALT 14 02/12/2015 0941   ALT 12* 12/11/2014 1018   ALKPHOS 77 02/12/2015 0941   ALKPHOS 83 12/11/2014 1018   BILITOT <0.1* 02/12/2015 0941   GFRNONAA >60 02/12/2015 0941   GFRNONAA >60  12/11/2014 1018   GFRNONAA 87 11/29/2011 1043   GFRAA >60 02/12/2015 0941   GFRAA >60 12/11/2014 1018   GFRAA >89 11/29/2011 1043   Lab Results  Component Value Date   WBC 2.2* 02/12/2015   NEUTROABS 0.6* 02/12/2015   HGB 10.3* 02/12/2015   HCT 32.5* 02/12/2015   MCV 95.4 02/12/2015   PLT 118* 02/12/2015  ANC 600.   STUDIES: 06/02/14 - CT scan of abdomen/pelvis with contrast. IMPRESSION:  1. Innumerable masses in the liver highly suspicious for diffuse hepatic metastatic disease. A definite source is not identified, although candidate sources include the wall thickening at the anorectal junction; wall thickening in the sigmoid colon (which may be otherwise attributable to sigmoid colon diverticulosis) ; or less likely the slight wall thickening in the stomach. There also several lesions in the upper spleen, as well as a nodule at the right lung base. Nuclear medicine PET-CT may help in further assessment and localization; otherwise, rectal exam and endoscopy may be important parts of the diagnostic workup.  2. Paraspinal nodules on both sides of the T9 vertebral body could be from extra medullary hematopoiesis or malignancy. 3. More gag knee hernia containing a considerable amount of omentum.  4. Chronic compression fracture at  L1 with chronic grade 1 retrolisthesis at L2-3.  5. Aortoiliac atherosclerosis with infrarenal abdominal aortic ectasia.  06/04/14 - serum CEA is 80.7, serum CA-125 is 78.  09/22/14 - CT chest. IMPRESSION:  1. Response to therapy of thoracic nodal metastasis. 2. Although liver lesions are suboptimally evaluated secondary to noncontrast technique, felt to be improved. 3. Progression of T11 compression deformity with similar T8 and new T5 mild compression deformities. No dominant underlying osseous lesion is identified.  11/10/14 - CT chest without contrast.  IMPRESSION: 1. Similar to decreased size of thoracic lymph nodes. . Liver lesions are again suboptimally evaluated secondary to lack of IV contrast. Grossly similar to 09/22/14. Significantly improved since 08/04/14. 3. Increased right lower lobe nodularity. Suspicious for interval infection, especially given the clinical history of cough.  4. No sites of progressive disease identified. 5. Cholelithiasis.  6.  Atherosclerosis, including within the coronary arteries.   Ct Chest Wo Contrast  02/10/2015   CLINICAL DATA:  Restaging of right-sided lung cancer. Chemotherapy ending 1 month ago. Ex-smoker.  EXAM: CT CHEST WITHOUT CONTRAST  TECHNIQUE: Multidetector CT imaging of the chest was performed following the standard protocol without IV contrast.  COMPARISON:  11/10/2014  FINDINGS: Mediastinum/Nodes: No supraclavicular adenopathy. Aortic and branch vessel atherosclerosis. Tortuous descending thoracic aorta. Normal heart size, without pericardial effusion. Left circumflex coronary artery atherosclerosis.  Fat containing anterior left paracentral diaphragmatic hernia extending into the right sided chest, similar.  1.0 cm right paratracheal node is similar on image 22.  A precarinal node measures 1.3 cm on image 23 versus 1.1 cm on the prior.  High right paratracheal 9 mm node on image 17 is enlarged from approximately 3 mm on the prior.  Hilar regions  poorly evaluated without intravenous contrast.  Right prevascular node measures 7 mm on image 22 versus 4 mm on the prior.  Left worse than right paravertebral nodularity again identified. 1.3 cm on the left versus 1.0 cm on the prior.  Lungs/Pleura: Minimal bilateral pleural thickening, without pleural fluid.  Patent airways.  Mild centrilobular emphysema.  Anterior inferior right middle lobe volume loss with mild bronchiectasis is similar, including on image 39.  Right base scarring again identified. Nodular component measures 5 mm on image 47  and is unchanged.  Right lower lobe clustered nodules are felt to be similar, including on image 40.  Subtle nodularity in the anterior right upper lobe on image 17 is felt to be chronic and likely infectious or inflammatory.  Upper abdomen: Hepatic metastasis again poorly evaluated. Lesion in segment 4 of the liver measures 1.7 x 1.0 cm on image 56 versus 1.8 x 1.3 cm on the prior. Similar to slightly improved. Suspected new high right hepatic lobe lesion at 1.6 cm on image 51.  Posterior lateral segment left liver lobe subtle low-density on image 54 measures 1.3 x 1.3 cm and is likely new.  Normal imaged portions of the spleen, stomach, pancreas, adrenal glands. Renal cortical thinning. Cholelithiasis. Advanced abdominal aortic atherosclerosis.  Musculoskeletal: Severe compression deformity again identified at T11 with mild ventral canal encroachment. T7 and T8 superior endplate compression deformities. The T7 deformity is new. The T5 mild superior endplate compression deformity is chronic. Remote right clavicular trauma.  IMPRESSION: 1. Enlarging thoracic nodes, suspicious for disease progression. 2. Although the liver is again suboptimally evaluated, hepatic metastatic burden is felt to be increased. New lesions are identified. 3. Similar appearance of the lungs, with scattered areas of clustered nodularity which are favored to be post infectious or inflammatory. 4.   Atherosclerosis, including within the coronary arteries. 5. New mild T7 compression deformity. Other thoracic vertebral body compression deformities are similar. 6. Cholelithiasis.   Electronically Signed   By: Abigail Miyamoto M.D.   On: 02/10/2015 11:03    ASSESSMENT / PLAN:   1. Neutropenia - has developed neutropenia despite being off of chemo for 2-3 months, ?secondary to metastatic lung cancer vs other etiology. Will give supportive Granix (G-CSF) 300 mcg BIW and monitor for improvement. Will see her back in 2 weeks and make continued treatment planning.   2. 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 Started palliative chemotherapy with carboplatin/etoposide on 06/25/14  - Have independently reviewed CT chest from yesterday and d/w patient/family present and explained that there is evidence of progression of malignancy. She is currently slowly improving. Given her age and poor PS (ECOG 2), have discussed further options including supportive care/hospice versus pursuing second-line chemo like Topotecan if WBC/ANC improves. She wants to pursue treatment if feasible. Will f/u in 2 weeks and make further treatment plan.  3. Anemia - likely secondary to chemotherapy and underlying malignancy. No progressive anemia symptoms, fatigue is same. Continue to monitor and consider transfusion if it drops to less than 7 or progressive symptoms. 4. Thrombocytopenia - secondary to chemotherapy side effect. No bleeding issues. Continue to monitor. 5. Thoracic compression fracture, back pain -  Back pain is under control. Patient does not want to see Orthopedics at this time. In between visits, patient advised to call or come to ER in case of any fevers, bleeding, decreased appetite, new symptoms or acute sickness. She is agreeable to this plan.     Leia Alf, MD   02/22/2015 10:36 AM

## 2015-02-25 ENCOUNTER — Encounter: Payer: Self-pay | Admitting: Internal Medicine

## 2015-02-25 ENCOUNTER — Ambulatory Visit (INDEPENDENT_AMBULATORY_CARE_PROVIDER_SITE_OTHER): Payer: Medicare Other | Admitting: Internal Medicine

## 2015-02-25 VITALS — BP 110/60 | HR 86 | Temp 98.0°F | Ht 62.0 in | Wt 134.0 lb

## 2015-02-25 DIAGNOSIS — Z66 Do not resuscitate: Secondary | ICD-10-CM | POA: Diagnosis not present

## 2015-02-25 DIAGNOSIS — E559 Vitamin D deficiency, unspecified: Secondary | ICD-10-CM | POA: Diagnosis not present

## 2015-02-25 DIAGNOSIS — I10 Essential (primary) hypertension: Secondary | ICD-10-CM | POA: Diagnosis not present

## 2015-02-25 DIAGNOSIS — M4854XA Collapsed vertebra, not elsewhere classified, thoracic region, initial encounter for fracture: Secondary | ICD-10-CM

## 2015-02-25 DIAGNOSIS — Z7189 Other specified counseling: Secondary | ICD-10-CM

## 2015-02-25 DIAGNOSIS — Z0000 Encounter for general adult medical examination without abnormal findings: Secondary | ICD-10-CM

## 2015-02-25 DIAGNOSIS — M8448XA Pathological fracture, other site, initial encounter for fracture: Secondary | ICD-10-CM | POA: Diagnosis not present

## 2015-02-25 LAB — COMPREHENSIVE METABOLIC PANEL
ALBUMIN: 4.4 g/dL (ref 3.5–5.2)
ALK PHOS: 86 U/L (ref 39–117)
ALT: 15 U/L (ref 0–35)
AST: 27 U/L (ref 0–37)
BILIRUBIN TOTAL: 0.4 mg/dL (ref 0.2–1.2)
BUN: 18 mg/dL (ref 6–23)
CO2: 26 mEq/L (ref 19–32)
Calcium: 9.2 mg/dL (ref 8.4–10.5)
Chloride: 102 mEq/L (ref 96–112)
Creatinine, Ser: 0.94 mg/dL (ref 0.40–1.20)
GFR: 61.26 mL/min (ref >60.00)
Glucose, Bld: 117 mg/dL — ABNORMAL HIGH (ref 70–99)
POTASSIUM: 4.2 meq/L (ref 3.5–5.1)
Sodium: 139 mEq/L (ref 135–145)
Total Protein: 8.2 g/dL (ref 6.0–8.3)

## 2015-02-25 LAB — VITAMIN D 25 HYDROXY (VIT D DEFICIENCY, FRACTURES): VITD: 34.73 ng/mL (ref 30.00–100.00)

## 2015-02-25 NOTE — Progress Notes (Signed)
Pre visit review using our clinic review tool, if applicable. No additional management support is needed unless otherwise documented below in the visit note. 

## 2015-02-25 NOTE — Patient Instructions (Addendum)
Your goal BP is 100 to 130/ 60 to 84  If your readings continue to be high at the end of one more week,  Make an appt with Juliann Pulse to have it rechecked and BRING YOUR BLOOD PRESSURE MACHINE   You have a new compression fracture at the T7 level,  And old compression fractures at the  T5, and t11 levels  The nasal spray called "Calcitonin"   may help the back pain and may strengthen your bones.   I recommend getting the majority of your calcium and Vitamin D  through diet rather than supplements given the recent association of calcium supplements with increased coronary artery calcium scores  Try the almond and cashew milks that most grocery stores  now carry  in the dairy  Section>   They are lactose free:  [employer] brand Almond Light,  Original formula.  Delicious,  Low carb,  Low cal,  Cholesterol free   Please display the Do Not Resuscitate order in your home   We will have you return to see Denisa to discuss the Living will and MOST forms

## 2015-02-25 NOTE — Assessment & Plan Note (Addendum)
Her recent episode of hypotension resolved,  And she is now back an amlodipine,   hctz resumed several days ago for elevated home readings.

## 2015-02-25 NOTE — Progress Notes (Signed)
Patient ID: Nancy Doyle, female    DOB: 06/23/1935  Age: 80 y.o. MRN: [id number]  The patient is here for annual Medicare wellness examination and management of other chronic and acute problems.   The risk factors are reflected in the social history.  The roster of all physicians providing medical care to patient - is listed in the Snapshot section of the chart.  Activities of daily living:  The patient is 100% independent in all ADLs: dressing, toileting, feeding as well as independent mobility  Home safety : The patient has smoke detectors in the home. They wear seatbelts.  There are no firearms at home. There is no violence in the home.   There is no risks for hepatitis, STDs or HIV. There is no   history of blood transfusion. They have no travel history to infectious disease endemic areas of the world.  The patient has seen their dentist in the last six month. They have seen their eye doctor in the last year. They admit to slight hearing difficulty with regard to whispered voices and some television programs.  They have deferred audiologic testing in the last year.  They do not  have excessive sun exposure. Discussed the need for sun protection: hats, long sleeves and use of sunscreen if there is significant sun exposure.   Diet: the importance of a healthy diet is discussed. They do have a healthy diet.  The benefits of regular aerobic exercise were discussed. She walks 4 times per week ,  20 minutes.   Depression screen: there are no signs or vegative symptoms of depression- irritability, change in appetite, anhedonia, sadness/tearfullness.  Cognitive assessment: the patient manages all their financial and personal affairs and is actively engaged. They could relate day,date,year and events; recalled 2/3 objects at 3 minutes; performed clock-face test normally.  The following portions of the patient's history were reviewed and updated as appropriate: allergies, current medications,  past family history, past medical history,  past surgical history, past social history  and problem list.  Visual acuity was not assessed per patient preference since she has regular follow up with her ophthalmologist. Hearing and body mass index were assessed and reviewed.   During the course of the visit the patient was educated and counseled about appropriate screening and preventive services including : fall prevention , diabetes screening, nutrition counseling, colorectal cancer screening, and recommended immunizations.    CC: The primary encounter diagnosis was Essential hypertension. Diagnoses of Do not resuscitate discussion, DNR no code (do not resuscitate), Vitamin D deficiency, Non-traumatic compression fracture of T7 thoracic vertebra, initial encounter, and Visit for preventive health examination were also pertinent to this visit. and worsening back pain  ,  Occurred after a fall in the bathtub .  Ct scan shows a  New T7 compression fracture,  And Old T11  compression fracture  And T5 endplate compression fractures. She has metastatic small cell lung Ca    History [place] has a past medical history of GI bleed (Nov 2009); Hypercholesterolemia; Diverticulitis (2005); Hypertension; Osteoporosis; Tobacco abuse; Tobacco abuse counseling; and Cancer.   She has past surgical history that includes Incontinence surgery (2003); Appendectomy (Age 7); and Cataract extraction (Jan 2011).   Her family history is not on file.She reports that she has quit smoking. Her smoking use included Cigarettes. She has a 7.5 pack-year smoking history. She has never used smokeless tobacco. She reports that she does not drink alcohol or use illicit drugs.  Outpatient Prescriptions Prior to Visit  Medication Sig Dispense Refill  . ALPRAZolam (XANAX) 0.5 MG tablet Take 1 tablet (0.5 mg total) by mouth 2 (two) times daily as needed. 60 tablet 3  . amLODipine (NORVASC) 10 MG tablet TAKE 1 TABLET (10 MG TOTAL) BY  MOUTH DAILY. 30 tablet 5  . amLODipine (NORVASC) 5 MG tablet Take 1 tablet (5 mg total) by mouth daily. As needed for BP > 150 30 tablet 2  . HYDROcodone-acetaminophen (NORCO) 10-325 MG per tablet Take 1 tablet by mouth every 6 (six) hours as needed. 120 tablet 0  . latanoprost (XALATAN) 0.005 % ophthalmic solution Place 1 drop into both eyes at bedtime.    . metoCLOPramide (REGLAN) 10 MG tablet Take 10 mg by mouth 4 (four) times daily.    . mirtazapine (REMERON) 15 MG tablet Take 1 tablet (15 mg total) by mouth at bedtime. 30 tablet 3  . multivitamin-iron-minerals-folic acid (CENTRUM) chewable tablet Chew 1 tablet by mouth daily.      . ondansetron (ZOFRAN) 4 MG tablet Take 1 tablet (4 mg total) by mouth 3 (three) times daily as needed for nausea or vomiting. 90 tablet 1  . pantoprazole (PROTONIX) 40 MG tablet Take 1 tablet (40 mg total) by mouth 2 (two) times daily. 30 tablet 3  . potassium chloride SA (K-DUR,KLOR-CON) 20 MEQ tablet Take 20 mEq by mouth 2 (two) times daily.    Marland Kitchen senna-docusate (SENOKOT-S) 8.6-50 MG per tablet Take 1 tablet by mouth at bedtime.    Marland Kitchen venlafaxine XR (EFFEXOR-XR) 37.5 MG 24 hr capsule Take 37.5 mg by mouth daily.  3  . Calcium Carbonate-Vitamin D (CALCIUM + D) 600-200 MG-UNIT TABS Take 1 tablet by mouth daily.      Marland Kitchen albuterol (PROVENTIL HFA;VENTOLIN HFA) 108 (90 BASE) MCG/ACT inhaler Inhale 2 puffs into the lungs every 6 (six) hours as needed for wheezing or shortness of breath. 1 Inhaler 11  . levofloxacin (LEVAQUIN) 500 MG tablet Take 500 mg by mouth daily.     No facility-administered medications prior to visit.    Review of Systems   Patient denies headache, fevers, malaise, unintentional weight loss, skin rash, eye pain, sinus congestion and sinus pain, sore throat, dysphagia,  hemoptysis , cough, dyspnea, wheezing, chest pain, palpitations, orthopnea, edema, abdominal pain, nausea, melena, diarrhea, constipation, flank pain, dysuria, hematuria, urinary   Frequency, nocturia, numbness, tingling, seizures,  Focal weakness, Loss of consciousness,  Tremor, insomnia, depression, anxiety, and suicidal ideation.      Objective:  BP 110/60 mmHg  Pulse 86  Temp(Src) 98 F (36.7 C) (Oral)  Ht 5' 2"  (1.575 m)  Wt 134 lb (60.782 kg)  BMI 24.50 kg/m2  SpO2 95%  Physical Exam   General appearance: alert, cooperative and appears stated age Head: Normocephalic, without obvious abnormality, atraumatic Eyes: conjunctivae/corneas clear. PERRL, EOM's intact. Fundi benign. Ears: normal TM's and external ear canals both ears Nose: Nares normal. Septum midline. Mucosa normal. No drainage or sinus tenderness. Throat: lips, mucosa, and tongue normal; teeth and gums normal Neck: no adenopathy, no carotid bruit, no JVD, supple, symmetrical, trachea midline and thyroid not enlarged, symmetric, no tenderness/mass/nodules Lungs: clear to auscultation bilaterally Breasts: normal appearance, no masses or tenderness Heart: regular rate and rhythm, S1, S2 normal, no murmur, click, rub or gallop Abdomen: soft, non-tender; bowel sounds normal; no masses,  no organomegaly Extremities: extremities normal, atraumatic, no cyanosis or edema Pulses: 2+ and symmetric Skin: Skin color, texture, turgor normal. No rashes or lesions Neurologic: Alert and oriented X  3, normal strength and tone. Normal symmetric reflexes. Normal coordination and gait.   Assessment & Plan:   Problem List Items Addressed This Visit      Unprioritized   Visit for preventive health examination    Annual wellness  exam was done as well as a comprehensive physical exam  .  During the course of the visit the patient was educated and counseled about appropriate screening and preventive services and screenings were brought up to date for cervical and breast cancer .  She will return for fasting labs to provide samples for diabetes screening and lipid analysis with projected  10 year  risk for CAD.  nutrition counseling, skin cancer screening has been recommended, along with review of the age appropriate recommended immunizations.  Printed recommendations for health maintenance screenings was given.        Essential hypertension - Primary    Her recent episode of hypotension resolved,  And she is now back an amlodipine,   hctz resumed several days ago for elevated home readings.       Relevant Orders   Comp Met (CMET) (Completed)   DNR no code (do not resuscitate)    End of life discussion was had today.  She will return for  Discussion of the MOST form.       Non-traumatic compression fracture of T7 thoracic vertebra    She is having increased pain ,  Likely from bone metastasis,  Increasing vicodin to 10/325 mg every 6 hours as needed.       Do not resuscitate discussion    Other Visit Diagnoses    Vitamin D deficiency        Relevant Orders    Vit D  25 hydroxy (rtn osteoporosis monitoring) (Completed)       I have discontinued Ms. Vandenberghe's Calcium Carbonate-Vitamin D, levofloxacin, and albuterol. I am also having her maintain her multivitamin-iron-minerals-folic acid, pantoprazole, ondansetron, senna-docusate, potassium chloride SA, latanoprost, metoCLOPramide, ALPRAZolam, venlafaxine XR, mirtazapine, amLODipine, HYDROcodone-acetaminophen, and amLODipine.  No orders of the defined types were placed in this encounter.    Medications Discontinued During This Encounter  Medication Reason  . levofloxacin (LEVAQUIN) 500 MG tablet Completed Course  . albuterol (PROVENTIL HFA;VENTOLIN HFA) 108 (90 BASE) MCG/ACT inhaler Prescription never filled  . Calcium Carbonate-Vitamin D (CALCIUM + D) 600-200 MG-UNIT TABS Patient has not taken in last 30 days    Follow-up: Return in about 4 weeks (around 03/25/2015).   Crecencio Mc, MD  +

## 2015-02-26 ENCOUNTER — Inpatient Hospital Stay: Payer: Medicare Other | Attending: Internal Medicine | Admitting: Internal Medicine

## 2015-02-26 VITALS — BP 149/67 | HR 78 | Temp 97.1°F | Resp 18 | Ht 62.0 in | Wt 136.0 lb

## 2015-02-26 DIAGNOSIS — E785 Hyperlipidemia, unspecified: Secondary | ICD-10-CM

## 2015-02-26 DIAGNOSIS — M818 Other osteoporosis without current pathological fracture: Secondary | ICD-10-CM | POA: Diagnosis not present

## 2015-02-26 DIAGNOSIS — Z85820 Personal history of malignant melanoma of skin: Secondary | ICD-10-CM

## 2015-02-26 DIAGNOSIS — D649 Anemia, unspecified: Secondary | ICD-10-CM | POA: Diagnosis not present

## 2015-02-26 DIAGNOSIS — D7389 Other diseases of spleen: Secondary | ICD-10-CM | POA: Diagnosis not present

## 2015-02-26 DIAGNOSIS — I2510 Atherosclerotic heart disease of native coronary artery without angina pectoris: Secondary | ICD-10-CM

## 2015-02-26 DIAGNOSIS — C781 Secondary malignant neoplasm of mediastinum: Secondary | ICD-10-CM | POA: Diagnosis not present

## 2015-02-26 DIAGNOSIS — K8020 Calculus of gallbladder without cholecystitis without obstruction: Secondary | ICD-10-CM

## 2015-02-26 DIAGNOSIS — M549 Dorsalgia, unspecified: Secondary | ICD-10-CM | POA: Diagnosis not present

## 2015-02-26 DIAGNOSIS — E780 Pure hypercholesterolemia: Secondary | ICD-10-CM | POA: Diagnosis not present

## 2015-02-26 DIAGNOSIS — IMO0001 Reserved for inherently not codable concepts without codable children: Secondary | ICD-10-CM

## 2015-02-26 DIAGNOSIS — Z87891 Personal history of nicotine dependence: Secondary | ICD-10-CM | POA: Diagnosis not present

## 2015-02-26 DIAGNOSIS — M8448XS Pathological fracture, other site, sequela: Secondary | ICD-10-CM | POA: Diagnosis not present

## 2015-02-26 DIAGNOSIS — G8929 Other chronic pain: Secondary | ICD-10-CM | POA: Diagnosis not present

## 2015-02-26 DIAGNOSIS — I10 Essential (primary) hypertension: Secondary | ICD-10-CM | POA: Diagnosis not present

## 2015-02-26 DIAGNOSIS — R0609 Other forms of dyspnea: Secondary | ICD-10-CM

## 2015-02-26 DIAGNOSIS — D696 Thrombocytopenia, unspecified: Secondary | ICD-10-CM

## 2015-02-26 DIAGNOSIS — C787 Secondary malignant neoplasm of liver and intrahepatic bile duct: Secondary | ICD-10-CM

## 2015-02-26 DIAGNOSIS — C3491 Malignant neoplasm of unspecified part of right bronchus or lung: Secondary | ICD-10-CM | POA: Diagnosis not present

## 2015-02-26 DIAGNOSIS — Z9221 Personal history of antineoplastic chemotherapy: Secondary | ICD-10-CM | POA: Diagnosis not present

## 2015-02-26 DIAGNOSIS — C771 Secondary and unspecified malignant neoplasm of intrathoracic lymph nodes: Secondary | ICD-10-CM

## 2015-02-26 DIAGNOSIS — R5383 Other fatigue: Secondary | ICD-10-CM | POA: Diagnosis not present

## 2015-02-28 DIAGNOSIS — M4854XA Collapsed vertebra, not elsewhere classified, thoracic region, initial encounter for fracture: Secondary | ICD-10-CM | POA: Insufficient documentation

## 2015-02-28 NOTE — Assessment & Plan Note (Signed)
She is having increased pain ,  Likely from bone metastasis,  Increasing vicodin to 10/325 mg every 6 hours as needed.

## 2015-02-28 NOTE — Assessment & Plan Note (Addendum)
End of life discussion was had today.  She will return for  Discussion of the MOST form.

## 2015-02-28 NOTE — Assessment & Plan Note (Signed)
Annual wellness  exam was done as well as a comprehensive physical exam  .  During the course of the visit the patient was educated and counseled about appropriate screening and preventive services and screenings were brought up to date for cervical and breast cancer .  She will return for fasting labs to provide samples for diabetes screening and lipid analysis with projected  10 year  risk for CAD. nutrition counseling, skin cancer screening has been recommended, along with review of the age appropriate recommended immunizations.  Printed recommendations for health maintenance screenings was given.

## 2015-03-02 ENCOUNTER — Encounter: Payer: Self-pay | Admitting: *Deleted

## 2015-03-02 NOTE — Progress Notes (Signed)
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 (presented with multiple liver lesions on CT scan of abdomen/pelvis on 06/02/14 which also reported wall thickening at the anorectal junction; wall thickening in the sigmoid colon; slight wall thickening in the stomach, several lesions in the upper spleen, as well as a nodule at the right lung base, paraspinal nodules on both sides of the T9 vertebral body could be from extra medullary hematopoiesis or malignancy, chronic compression fracture at L1 with chronic grade 1 retrolisthesis at L2-3, aortoiliac atherosclerosis with infrarenal abdominal aortic ectasia. 06/04/14 - serum CEA is 80.7, serum CA-125 is 78.  Started palliative chemotherapy with Carboplatin/Etoposide on 06/25/14. Got cycle 7 on April 11-13, 2016.    HISTORY OF PRESENT ILLNESS:  Patient returns for continued oncology followup, she was noted to have neutropenia 2 weeks ago,  Clinically states that she is doing steady, that chronic  fatigue and dyspnea on exertion is same. Eating steady.  No fever or chills. No new cough, chest pain, or hemoptysis. No new headaches or loss of consciousness.   REVIEW OF SYSTEMS:   ROS As in HPI above. In addition, no fever, chills or sweats. No new headaches or focal weakness.  No sore throat or dysphagia. No dizziness or palpitation. No abdominal pain, diarrhea, dysuria or hematuria. No new skin rash or bleeding symptoms. No new paresthesias in extremities.    PAST MEDICAL HISTORY: Reviewed. Past Medical History  Diagnosis Date  . GI bleed Nov 2009    3  ulcers found, required transfusion, repeat endoscopy showed mild gastritis  .  Hypercholesterolemia     refuses statin therapy  . Diverticulitis 2005    hospitalized  . Hypertension   . Osteoporosis   . Tobacco abuse   . Tobacco abuse counseling   . Cancer           Hypertension  Chronic back pain  Generalized anxiety disorder  Diverticulosis  History of upper GI bleed  Hyperlipidemia  Osteoporosis  Glaucoma  Cataract extraction  History of melanoma at age 80  PAST SURGICAL HISTORY: Reviewed. Past Surgical History  Procedure Laterality Date  . Incontinence surgery  2003    Dr. Davis Gourd  . Appendectomy  Age 80  . Cataract extraction  Jan 2011    Dr. Wallace Going   FAMILY HISTORY: Reviewed. Noncontributory, denies malignancy.  SOCIAL HISTORY: Reviewed. History  Substance Use Topics  . Smoking status: Former Smoker -- 0.50 packs/day for 15 years    Types: Cigarettes  . Smokeless tobacco: Never Used     Comment: Quit October 2015  . Alcohol Use: No    Allergies  Allergen Reactions  . Penicillins Rash    Current Outpatient Prescriptions  Medication Sig Dispense Refill  . ALPRAZolam (XANAX) 0.5 MG tablet Take 1 tablet (0.5 mg total) by mouth 2 (two) times daily as needed. 60 tablet 3  . amLODipine (NORVASC) 10 MG tablet TAKE 1 TABLET (10 MG TOTAL) BY MOUTH DAILY. 30 tablet 5  . amLODipine (NORVASC) 5 MG tablet Take 1 tablet (5 mg total) by mouth daily. As needed for BP > 150 30 tablet 2  . HYDROcodone-acetaminophen (NORCO) 10-325 MG per tablet Take 1 tablet by mouth every  6 (six) hours as needed. 120 tablet 0  . latanoprost (XALATAN) 0.005 % ophthalmic solution Place 1 drop into both eyes at bedtime.    . metoCLOPramide (REGLAN) 10 MG tablet Take 10 mg by mouth 4 (four) times daily.    . mirtazapine (REMERON) 15 MG tablet Take 1 tablet (15 mg total) by mouth at bedtime. 30 tablet 3  . multivitamin-iron-minerals-folic acid (CENTRUM) chewable tablet Chew 1 tablet by mouth daily.      . ondansetron (ZOFRAN) 4 MG tablet Take 1 tablet (4 mg total)  by mouth 3 (three) times daily as needed for nausea or vomiting. 90 tablet 1  . pantoprazole (PROTONIX) 40 MG tablet Take 1 tablet (40 mg total) by mouth 2 (two) times daily. 30 tablet 3  . potassium chloride SA (K-DUR,KLOR-CON) 20 MEQ tablet Take 20 mEq by mouth 2 (two) times daily.    Marland Kitchen senna-docusate (SENOKOT-S) 8.6-50 MG per tablet Take 1 tablet by mouth at bedtime.    Marland Kitchen venlafaxine XR (EFFEXOR-XR) 37.5 MG 24 hr capsule Take 37.5 mg by mouth daily.  3   No current facility-administered medications for this visit.    PHYSICAL EXAM: Filed Vitals:   02/26/15 1013  BP: 149/67  Pulse: 78  Temp: 97.1 F (36.2 C)  Resp: 18     Body mass index is 24.87 kg/(m^2).    ECOG FS:2 - Symptomatic, <50% confined to bed  GENERAL: Patient is alert and oriented and in no acute distress. There is no icterus. HEENT: EOMs intact. Oral exam negative for thrush or lesions. No cervical lymphadenopathy. CVS: S1S2, regular LUNGS: Bilaterally good air entry, no rhonchi. ABDOMEN: Soft, nontender.    EXTREMITIES: No pedal edema.   LAB RESULTS:    Component Value Date/Time   NA 139 02/25/2015 1426   NA 139 12/11/2014 1018   NA 135* 06/30/2014   K 4.2 02/25/2015 1426   K 3.5 12/11/2014 1018   CL 102 02/25/2015 1426   CL 102 12/11/2014 1018   CO2 26 02/25/2015 1426   CO2 23 12/11/2014 1018   GLUCOSE 117* 02/25/2015 1426   GLUCOSE 133* 12/11/2014 1018   BUN 18 02/25/2015 1426   BUN 14 12/11/2014 1018   BUN 15 06/30/2014   CREATININE 0.94 02/25/2015 1426   CREATININE 0.76 12/11/2014 1018   CREATININE 0.6 06/30/2014   CREATININE 0.66 11/29/2011 1043   CALCIUM 9.2 02/25/2015 1426   CALCIUM 8.9 12/11/2014 1018   PROT 8.2 02/25/2015 1426   PROT 7.8 12/11/2014 1018   ALBUMIN 4.4 02/25/2015 1426   ALBUMIN 4.4 12/11/2014 1018   AST 27 02/25/2015 1426   AST 20 12/11/2014 1018   ALT 15 02/25/2015 1426   ALT 12* 12/11/2014 1018   ALKPHOS 86 02/25/2015 1426   ALKPHOS 83 12/11/2014 1018   BILITOT 0.4  02/25/2015 1426   GFRNONAA >60 02/12/2015 0941   GFRNONAA >60 12/11/2014 1018   GFRNONAA 87 11/29/2011 1043   GFRAA >60 02/12/2015 0941   GFRAA >60 12/11/2014 1018   GFRAA >89 11/29/2011 1043   Lab Results  Component Value Date   WBC 2.2* 02/12/2015   NEUTROABS 0.6* 02/12/2015   HGB 10.3* 02/12/2015   HCT 32.5* 02/12/2015   MCV 95.4 02/12/2015   PLT 118* 02/12/2015  ANC 600.   STUDIES: 06/02/14 - CT scan of abdomen/pelvis with contrast. IMPRESSION:  1. Innumerable masses in the liver highly suspicious for diffuse hepatic metastatic disease. A definite source is not identified, although candidate sources include the  wall thickening at the anorectal junction; wall thickening in the sigmoid colon (which may be otherwise attributable to sigmoid colon diverticulosis) ; or less likely the slight wall thickening in the stomach. There also several lesions in the upper spleen, as well as a nodule at the right lung base. Nuclear medicine PET-CT may help in further assessment and localization; otherwise, rectal exam and endoscopy may be important parts of the diagnostic workup.  2. Paraspinal nodules on both sides of the T9 vertebral body could be from extra medullary hematopoiesis or malignancy. 3. More gag knee hernia containing a considerable amount of omentum.  4. Chronic compression fracture at L1 with chronic grade 1 retrolisthesis at L2-3.  5. Aortoiliac atherosclerosis with infrarenal abdominal aortic ectasia.  06/04/14 - serum CEA is 80.7, serum CA-125 is 78.  09/22/14 - CT chest. IMPRESSION:  1. Response to therapy of thoracic nodal metastasis. 2. Although liver lesions are suboptimally evaluated secondary to noncontrast technique, felt to be improved. 3. Progression of T11 compression deformity with similar T8 and new T5 mild compression deformities. No dominant underlying osseous lesion is identified.  11/10/14 - CT chest without contrast.  IMPRESSION: 1. Similar to decreased size of  thoracic lymph nodes. . Liver lesions are again suboptimally evaluated secondary to lack of IV contrast. Grossly similar to 09/22/14. Significantly improved since 08/04/14. 3. Increased right lower lobe nodularity. Suspicious for interval infection, especially given the clinical history of cough.  4. No sites of progressive disease identified. 5. Cholelithiasis.  6.  Atherosclerosis, including within the coronary arteries.   Ct Chest Wo Contrast  02/10/2015   CLINICAL DATA:  Restaging of right-sided lung cancer. Chemotherapy ending 1 month ago. Ex-smoker.  EXAM: CT CHEST WITHOUT CONTRAST  TECHNIQUE: Multidetector CT imaging of the chest was performed following the standard protocol without IV contrast.  COMPARISON:  11/10/2014  FINDINGS: Mediastinum/Nodes: No supraclavicular adenopathy. Aortic and branch vessel atherosclerosis. Tortuous descending thoracic aorta. Normal heart size, without pericardial effusion. Left circumflex coronary artery atherosclerosis.  Fat containing anterior left paracentral diaphragmatic hernia extending into the right sided chest, similar.  1.0 cm right paratracheal node is similar on image 22.  A precarinal node measures 1.3 cm on image 23 versus 1.1 cm on the prior.  High right paratracheal 9 mm node on image 17 is enlarged from approximately 3 mm on the prior.  Hilar regions poorly evaluated without intravenous contrast.  Right prevascular node measures 7 mm on image 22 versus 4 mm on the prior.  Left worse than right paravertebral nodularity again identified. 1.3 cm on the left versus 1.0 cm on the prior.  Lungs/Pleura: Minimal bilateral pleural thickening, without pleural fluid.  Patent airways.  Mild centrilobular emphysema.  Anterior inferior right middle lobe volume loss with mild bronchiectasis is similar, including on image 39.  Right base scarring again identified. Nodular component measures 5 mm on image 47 and is unchanged.  Right lower lobe clustered nodules are felt to  be similar, including on image 40.  Subtle nodularity in the anterior right upper lobe on image 17 is felt to be chronic and likely infectious or inflammatory.  Upper abdomen: Hepatic metastasis again poorly evaluated. Lesion in segment 4 of the liver measures 1.7 x 1.0 cm on image 56 versus 1.8 x 1.3 cm on the prior. Similar to slightly improved. Suspected new high right hepatic lobe lesion at 1.6 cm on image 51.  Posterior lateral segment left liver lobe subtle low-density on image 54 measures 1.3 x 1.3  cm and is likely new.  Normal imaged portions of the spleen, stomach, pancreas, adrenal glands. Renal cortical thinning. Cholelithiasis. Advanced abdominal aortic atherosclerosis.  Musculoskeletal: Severe compression deformity again identified at T11 with mild ventral canal encroachment. T7 and T8 superior endplate compression deformities. The T7 deformity is new. The T5 mild superior endplate compression deformity is chronic. Remote right clavicular trauma.  IMPRESSION: 1. Enlarging thoracic nodes, suspicious for disease progression. 2. Although the liver is again suboptimally evaluated, hepatic metastatic burden is felt to be increased. New lesions are identified. 3. Similar appearance of the lungs, with scattered areas of clustered nodularity which are favored to be post infectious or inflammatory. 4.  Atherosclerosis, including within the coronary arteries. 5. New mild T7 compression deformity. Other thoracic vertebral body compression deformities are similar. 6. Cholelithiasis.   Electronically Signed   By: Abigail Miyamoto M.D.   On: 02/10/2015 11:03    ASSESSMENT / PLAN:   1. 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 Started palliative chemotherapy with carboplatin/etoposide on 06/25/14 CT chest showed progression of malignancy. She is currently slowly improving. Given her age and poor PS (ECOG 2), have discussed further options including  supportive care/hospice versus pursuing second-line chemo like Topotecan if WBC/ANC improves. She wants to pursue treatment if feasible. Patient again explained about palliative intent of treatment, overall response rates which are low, possible side effects and she is agreeable to pursue this treatment and expressed verbal consent. Will pursue chemotherapy with weekly topotecan 3 mg/m (3 weeks on and one week off cycles) starting on July 21. Monitor weekly labs. Next M.D. follow-up on August 18 with labs and planned cycle 2 treatment based on how she tolerates. Could consider dose escalating topotecan to 4 mg/m if she does well.   2. Anemia - likely secondary to chemotherapy and underlying malignancy. No progressive anemia symptoms, fatigue is same. Continue to monitor and consider transfusion if it drops to less than 7 or progressive symptoms. 3. Thrombocytopenia - secondary to chemotherapy side effect. No bleeding issues. Continue to monitor. 4. Thoracic compression fracture, back pain -  Back pain is under control. Patient does not want to see Orthopedics at this time. In between visits, patient advised to call or come to ER in case of any fevers, bleeding, decreased appetite, new symptoms or acute sickness. She is agreeable to this plan.    Leia Alf, MD   03/02/2015 9:24 PM

## 2015-03-05 ENCOUNTER — Inpatient Hospital Stay: Payer: Medicare Other

## 2015-03-05 ENCOUNTER — Other Ambulatory Visit: Payer: Self-pay | Admitting: Family Medicine

## 2015-03-05 DIAGNOSIS — D7389 Other diseases of spleen: Secondary | ICD-10-CM | POA: Diagnosis not present

## 2015-03-05 DIAGNOSIS — I2510 Atherosclerotic heart disease of native coronary artery without angina pectoris: Secondary | ICD-10-CM | POA: Diagnosis not present

## 2015-03-05 DIAGNOSIS — C3491 Malignant neoplasm of unspecified part of right bronchus or lung: Secondary | ICD-10-CM | POA: Diagnosis not present

## 2015-03-05 DIAGNOSIS — M549 Dorsalgia, unspecified: Secondary | ICD-10-CM | POA: Diagnosis not present

## 2015-03-05 DIAGNOSIS — G8929 Other chronic pain: Secondary | ICD-10-CM | POA: Diagnosis not present

## 2015-03-05 DIAGNOSIS — R5383 Other fatigue: Secondary | ICD-10-CM | POA: Diagnosis not present

## 2015-03-05 DIAGNOSIS — C787 Secondary malignant neoplasm of liver and intrahepatic bile duct: Secondary | ICD-10-CM | POA: Diagnosis not present

## 2015-03-05 DIAGNOSIS — M818 Other osteoporosis without current pathological fracture: Secondary | ICD-10-CM | POA: Diagnosis not present

## 2015-03-05 DIAGNOSIS — D649 Anemia, unspecified: Secondary | ICD-10-CM | POA: Diagnosis not present

## 2015-03-05 DIAGNOSIS — E785 Hyperlipidemia, unspecified: Secondary | ICD-10-CM | POA: Diagnosis not present

## 2015-03-05 DIAGNOSIS — Z85820 Personal history of malignant melanoma of skin: Secondary | ICD-10-CM | POA: Diagnosis not present

## 2015-03-05 DIAGNOSIS — R0609 Other forms of dyspnea: Secondary | ICD-10-CM | POA: Diagnosis not present

## 2015-03-05 DIAGNOSIS — C781 Secondary malignant neoplasm of mediastinum: Secondary | ICD-10-CM | POA: Diagnosis not present

## 2015-03-05 DIAGNOSIS — M8448XS Pathological fracture, other site, sequela: Secondary | ICD-10-CM | POA: Diagnosis not present

## 2015-03-05 DIAGNOSIS — I10 Essential (primary) hypertension: Secondary | ICD-10-CM | POA: Diagnosis not present

## 2015-03-05 DIAGNOSIS — Z87891 Personal history of nicotine dependence: Secondary | ICD-10-CM | POA: Diagnosis not present

## 2015-03-05 DIAGNOSIS — IMO0001 Reserved for inherently not codable concepts without codable children: Secondary | ICD-10-CM

## 2015-03-05 DIAGNOSIS — K8020 Calculus of gallbladder without cholecystitis without obstruction: Secondary | ICD-10-CM | POA: Diagnosis not present

## 2015-03-05 DIAGNOSIS — Z9221 Personal history of antineoplastic chemotherapy: Secondary | ICD-10-CM | POA: Diagnosis not present

## 2015-03-05 DIAGNOSIS — C771 Secondary and unspecified malignant neoplasm of intrathoracic lymph nodes: Secondary | ICD-10-CM | POA: Diagnosis not present

## 2015-03-05 DIAGNOSIS — D696 Thrombocytopenia, unspecified: Secondary | ICD-10-CM | POA: Diagnosis not present

## 2015-03-05 DIAGNOSIS — E780 Pure hypercholesterolemia: Secondary | ICD-10-CM | POA: Diagnosis not present

## 2015-03-05 LAB — CBC WITH DIFFERENTIAL/PLATELET
Basophils Absolute: 0 K/uL (ref 0–0.1)
Basophils Relative: 0 %
Eosinophils Absolute: 0 K/uL (ref 0–0.7)
Eosinophils Relative: 0 %
HCT: 34.4 % — ABNORMAL LOW (ref 35.0–47.0)
Hemoglobin: 10.9 g/dL — ABNORMAL LOW (ref 12.0–16.0)
Lymphocytes Relative: 49 %
Lymphs Abs: 1.4 K/uL (ref 1.0–3.6)
MCH: 29.5 pg (ref 26.0–34.0)
MCHC: 31.8 g/dL — ABNORMAL LOW (ref 32.0–36.0)
MCV: 92.9 fL (ref 80.0–100.0)
Monocytes Absolute: 0.6 K/uL (ref 0.2–0.9)
Monocytes Relative: 21 %
Neutro Abs: 0.9 K/uL — ABNORMAL LOW (ref 1.4–6.5)
Neutrophils Relative %: 30 %
Platelets: 135 K/uL — ABNORMAL LOW (ref 150–440)
RBC: 3.7 MIL/uL — ABNORMAL LOW (ref 3.80–5.20)
RDW: 17.1 % — ABNORMAL HIGH (ref 11.5–14.5)
WBC: 3 K/uL — ABNORMAL LOW (ref 3.6–11.0)

## 2015-03-11 ENCOUNTER — Inpatient Hospital Stay: Payer: Medicare Other

## 2015-03-11 VITALS — BP 100/48 | HR 76 | Temp 96.2°F | Resp 20

## 2015-03-11 DIAGNOSIS — C3491 Malignant neoplasm of unspecified part of right bronchus or lung: Secondary | ICD-10-CM | POA: Diagnosis not present

## 2015-03-11 DIAGNOSIS — Z85820 Personal history of malignant melanoma of skin: Secondary | ICD-10-CM | POA: Diagnosis not present

## 2015-03-11 DIAGNOSIS — C787 Secondary malignant neoplasm of liver and intrahepatic bile duct: Secondary | ICD-10-CM | POA: Diagnosis not present

## 2015-03-11 DIAGNOSIS — E785 Hyperlipidemia, unspecified: Secondary | ICD-10-CM | POA: Diagnosis not present

## 2015-03-11 DIAGNOSIS — R5383 Other fatigue: Secondary | ICD-10-CM | POA: Diagnosis not present

## 2015-03-11 DIAGNOSIS — I2510 Atherosclerotic heart disease of native coronary artery without angina pectoris: Secondary | ICD-10-CM | POA: Diagnosis not present

## 2015-03-11 DIAGNOSIS — I10 Essential (primary) hypertension: Secondary | ICD-10-CM | POA: Diagnosis not present

## 2015-03-11 DIAGNOSIS — Z9221 Personal history of antineoplastic chemotherapy: Secondary | ICD-10-CM | POA: Diagnosis not present

## 2015-03-11 DIAGNOSIS — R0609 Other forms of dyspnea: Secondary | ICD-10-CM | POA: Diagnosis not present

## 2015-03-11 DIAGNOSIS — M549 Dorsalgia, unspecified: Secondary | ICD-10-CM | POA: Diagnosis not present

## 2015-03-11 DIAGNOSIS — C781 Secondary malignant neoplasm of mediastinum: Secondary | ICD-10-CM | POA: Diagnosis not present

## 2015-03-11 DIAGNOSIS — E780 Pure hypercholesterolemia: Secondary | ICD-10-CM | POA: Diagnosis not present

## 2015-03-11 DIAGNOSIS — IMO0001 Reserved for inherently not codable concepts without codable children: Secondary | ICD-10-CM

## 2015-03-11 DIAGNOSIS — D649 Anemia, unspecified: Secondary | ICD-10-CM | POA: Diagnosis not present

## 2015-03-11 DIAGNOSIS — D696 Thrombocytopenia, unspecified: Secondary | ICD-10-CM | POA: Diagnosis not present

## 2015-03-11 DIAGNOSIS — C771 Secondary and unspecified malignant neoplasm of intrathoracic lymph nodes: Secondary | ICD-10-CM | POA: Diagnosis not present

## 2015-03-11 DIAGNOSIS — G8929 Other chronic pain: Secondary | ICD-10-CM | POA: Diagnosis not present

## 2015-03-11 DIAGNOSIS — M818 Other osteoporosis without current pathological fracture: Secondary | ICD-10-CM | POA: Diagnosis not present

## 2015-03-11 DIAGNOSIS — M8448XS Pathological fracture, other site, sequela: Secondary | ICD-10-CM | POA: Diagnosis not present

## 2015-03-11 DIAGNOSIS — K8020 Calculus of gallbladder without cholecystitis without obstruction: Secondary | ICD-10-CM | POA: Diagnosis not present

## 2015-03-11 DIAGNOSIS — D7389 Other diseases of spleen: Secondary | ICD-10-CM | POA: Diagnosis not present

## 2015-03-11 DIAGNOSIS — Z87891 Personal history of nicotine dependence: Secondary | ICD-10-CM | POA: Diagnosis not present

## 2015-03-11 LAB — CBC WITH DIFFERENTIAL/PLATELET
Basophils Absolute: 0 10*3/uL (ref 0–0.1)
Basophils Relative: 1 %
Eosinophils Absolute: 0 10*3/uL (ref 0–0.7)
Eosinophils Relative: 0 %
HCT: 36 % (ref 35.0–47.0)
Hemoglobin: 11.6 g/dL — ABNORMAL LOW (ref 12.0–16.0)
Lymphocytes Relative: 41 %
Lymphs Abs: 1.3 10*3/uL (ref 1.0–3.6)
MCH: 29.6 pg (ref 26.0–34.0)
MCHC: 32.1 g/dL (ref 32.0–36.0)
MCV: 92 fL (ref 80.0–100.0)
Monocytes Absolute: 0.5 10*3/uL (ref 0.2–0.9)
Monocytes Relative: 14 %
Neutro Abs: 1.4 10*3/uL (ref 1.4–6.5)
Neutrophils Relative %: 44 %
Platelets: 140 10*3/uL — ABNORMAL LOW (ref 150–440)
RBC: 3.91 MIL/uL (ref 3.80–5.20)
RDW: 16.7 % — ABNORMAL HIGH (ref 11.5–14.5)
WBC: 3.3 10*3/uL — ABNORMAL LOW (ref 3.6–11.0)

## 2015-03-11 MED ORDER — SODIUM CHLORIDE 0.9 % IV SOLN
Freq: Once | INTRAVENOUS | Status: AC
  Administered 2015-03-11: 11:00:00 via INTRAVENOUS
  Filled 2015-03-11: qty 4

## 2015-03-11 MED ORDER — TOPOTECAN HCL CHEMO INJECTION 4 MG
3.0000 mg/m2 | Freq: Once | INTRAVENOUS | Status: AC
  Administered 2015-03-11: 4.9 mg via INTRAVENOUS
  Filled 2015-03-11: qty 4.9

## 2015-03-11 MED ORDER — SODIUM CHLORIDE 0.9 % IV SOLN
Freq: Once | INTRAVENOUS | Status: AC
  Administered 2015-03-11: 11:00:00 via INTRAVENOUS
  Filled 2015-03-11: qty 1000

## 2015-03-12 ENCOUNTER — Inpatient Hospital Stay: Payer: Medicare Other

## 2015-03-13 ENCOUNTER — Telehealth: Payer: Self-pay | Admitting: *Deleted

## 2015-03-13 ENCOUNTER — Other Ambulatory Visit: Payer: Self-pay | Admitting: Internal Medicine

## 2015-03-13 NOTE — Telephone Encounter (Signed)
Opened in error

## 2015-03-19 ENCOUNTER — Ambulatory Visit: Payer: Medicare Other | Admitting: Internal Medicine

## 2015-03-19 ENCOUNTER — Inpatient Hospital Stay: Payer: Medicare Other

## 2015-03-19 ENCOUNTER — Inpatient Hospital Stay: Payer: Medicare Other | Attending: Internal Medicine

## 2015-03-19 DIAGNOSIS — R1310 Dysphagia, unspecified: Secondary | ICD-10-CM | POA: Diagnosis not present

## 2015-03-19 DIAGNOSIS — F419 Anxiety disorder, unspecified: Secondary | ICD-10-CM | POA: Diagnosis not present

## 2015-03-19 DIAGNOSIS — Z5111 Encounter for antineoplastic chemotherapy: Secondary | ICD-10-CM | POA: Insufficient documentation

## 2015-03-19 DIAGNOSIS — E785 Hyperlipidemia, unspecified: Secondary | ICD-10-CM | POA: Diagnosis not present

## 2015-03-19 DIAGNOSIS — Z85820 Personal history of malignant melanoma of skin: Secondary | ICD-10-CM | POA: Diagnosis not present

## 2015-03-19 DIAGNOSIS — C781 Secondary malignant neoplasm of mediastinum: Secondary | ICD-10-CM | POA: Insufficient documentation

## 2015-03-19 DIAGNOSIS — D6959 Other secondary thrombocytopenia: Secondary | ICD-10-CM | POA: Insufficient documentation

## 2015-03-19 DIAGNOSIS — M549 Dorsalgia, unspecified: Secondary | ICD-10-CM | POA: Diagnosis not present

## 2015-03-19 DIAGNOSIS — E780 Pure hypercholesterolemia: Secondary | ICD-10-CM | POA: Insufficient documentation

## 2015-03-19 DIAGNOSIS — IMO0001 Reserved for inherently not codable concepts without codable children: Secondary | ICD-10-CM

## 2015-03-19 DIAGNOSIS — M818 Other osteoporosis without current pathological fracture: Secondary | ICD-10-CM | POA: Diagnosis not present

## 2015-03-19 DIAGNOSIS — C3491 Malignant neoplasm of unspecified part of right bronchus or lung: Secondary | ICD-10-CM

## 2015-03-19 DIAGNOSIS — I10 Essential (primary) hypertension: Secondary | ICD-10-CM | POA: Insufficient documentation

## 2015-03-19 DIAGNOSIS — Z79899 Other long term (current) drug therapy: Secondary | ICD-10-CM | POA: Diagnosis not present

## 2015-03-19 DIAGNOSIS — D649 Anemia, unspecified: Secondary | ICD-10-CM | POA: Insufficient documentation

## 2015-03-19 DIAGNOSIS — R531 Weakness: Secondary | ICD-10-CM | POA: Diagnosis not present

## 2015-03-19 DIAGNOSIS — C7951 Secondary malignant neoplasm of bone: Secondary | ICD-10-CM | POA: Insufficient documentation

## 2015-03-19 DIAGNOSIS — R42 Dizziness and giddiness: Secondary | ICD-10-CM | POA: Insufficient documentation

## 2015-03-19 DIAGNOSIS — Z87891 Personal history of nicotine dependence: Secondary | ICD-10-CM | POA: Diagnosis not present

## 2015-03-19 DIAGNOSIS — G8929 Other chronic pain: Secondary | ICD-10-CM | POA: Diagnosis not present

## 2015-03-19 DIAGNOSIS — C3490 Malignant neoplasm of unspecified part of unspecified bronchus or lung: Secondary | ICD-10-CM | POA: Diagnosis not present

## 2015-03-19 DIAGNOSIS — C787 Secondary malignant neoplasm of liver and intrahepatic bile duct: Secondary | ICD-10-CM | POA: Insufficient documentation

## 2015-03-19 DIAGNOSIS — D709 Neutropenia, unspecified: Secondary | ICD-10-CM

## 2015-03-19 LAB — CBC WITH DIFFERENTIAL/PLATELET
Basophils Absolute: 0 10*3/uL (ref 0–0.1)
EOS ABS: 0 10*3/uL (ref 0–0.7)
Eosinophils Relative: 1 %
HCT: 33.9 % — ABNORMAL LOW (ref 35.0–47.0)
Hemoglobin: 10.9 g/dL — ABNORMAL LOW (ref 12.0–16.0)
Lymphs Abs: 1.4 10*3/uL (ref 1.0–3.6)
MCH: 29 pg (ref 26.0–34.0)
MCHC: 32.2 g/dL (ref 32.0–36.0)
MCV: 90.1 fL (ref 80.0–100.0)
Monocytes Absolute: 0.3 10*3/uL (ref 0.2–0.9)
Monocytes Relative: 13 %
Neutro Abs: 1 10*3/uL — ABNORMAL LOW (ref 1.4–6.5)
Neutrophils Relative %: 36 %
Platelets: 88 10*3/uL — ABNORMAL LOW (ref 150–440)
RBC: 3.76 MIL/uL — AB (ref 3.80–5.20)
RDW: 16.3 % — ABNORMAL HIGH (ref 11.5–14.5)
WBC: 2.7 10*3/uL — AB (ref 3.6–11.0)

## 2015-03-19 LAB — BASIC METABOLIC PANEL
ANION GAP: 9 (ref 5–15)
BUN: 18 mg/dL (ref 6–20)
CHLORIDE: 101 mmol/L (ref 101–111)
CO2: 28 mmol/L (ref 22–32)
CREATININE: 0.64 mg/dL (ref 0.44–1.00)
Calcium: 9.4 mg/dL (ref 8.9–10.3)
GFR calc Af Amer: >60 mL/min (ref >60)
GFR calc non Af Amer: >60 mL/min (ref >60)
GLUCOSE: 107 mg/dL — AB (ref 65–99)
Potassium: 3.7 mmol/L (ref 3.5–5.1)
Sodium: 138 mmol/L (ref 135–145)

## 2015-03-19 LAB — HEPATIC FUNCTION PANEL
ALBUMIN: 4.3 g/dL (ref 3.5–5.0)
ALK PHOS: 114 U/L (ref 38–126)
ALT: 16 U/L (ref 14–54)
AST: 23 U/L (ref 15–41)
BILIRUBIN TOTAL: 0.6 mg/dL (ref 0.3–1.2)
Indirect Bilirubin: 0 mg/dL — ABNORMAL LOW (ref 0.3–0.9)
Total Protein: 7.7 g/dL (ref 6.5–8.1)

## 2015-03-23 ENCOUNTER — Ambulatory Visit
Admission: RE | Admit: 2015-03-23 | Discharge: 2015-03-23 | Disposition: A | Discharge status: Home / Self Care | Payer: Medicare Other | Source: Ambulatory Visit | Attending: Internal Medicine | Admitting: Internal Medicine

## 2015-03-23 DIAGNOSIS — C3490 Malignant neoplasm of unspecified part of unspecified bronchus or lung: Secondary | ICD-10-CM | POA: Diagnosis not present

## 2015-03-23 DIAGNOSIS — IMO0001 Reserved for inherently not codable concepts without codable children: Secondary | ICD-10-CM

## 2015-03-23 DIAGNOSIS — M8448XA Pathological fracture, other site, initial encounter for fracture: Secondary | ICD-10-CM | POA: Diagnosis not present

## 2015-03-23 DIAGNOSIS — M4854XS Collapsed vertebra, not elsewhere classified, thoracic region, sequela of fracture: Secondary | ICD-10-CM | POA: Diagnosis present

## 2015-03-23 DIAGNOSIS — C7951 Secondary malignant neoplasm of bone: Secondary | ICD-10-CM | POA: Insufficient documentation

## 2015-03-23 DIAGNOSIS — C3491 Malignant neoplasm of unspecified part of right bronchus or lung: Secondary | ICD-10-CM | POA: Diagnosis not present

## 2015-03-23 DIAGNOSIS — M546 Pain in thoracic spine: Secondary | ICD-10-CM | POA: Diagnosis present

## 2015-03-23 DIAGNOSIS — Z720 Tobacco use: Secondary | ICD-10-CM | POA: Insufficient documentation

## 2015-03-23 MED ORDER — GADOBENATE DIMEGLUMINE 529 MG/ML IV SOLN
15.0000 mL | Freq: Once | INTRAVENOUS | Status: AC | PRN
  Administered 2015-03-23: 15 mL via INTRAVENOUS

## 2015-03-25 ENCOUNTER — Telehealth: Payer: Self-pay | Admitting: *Deleted

## 2015-03-25 MED ORDER — HYDROCODONE-ACETAMINOPHEN 10-325 MG PO TABS: 1.0000 | ORAL_TABLET | Freq: Four times a day (QID) | ORAL | Status: DC | PRN

## 2015-03-25 NOTE — Telephone Encounter (Signed)
Pt called requesting Hydrocodone refill.  Last OV 7.13.16, last refill 6.28.16.  Please advise refill

## 2015-03-25 NOTE — Telephone Encounter (Signed)
Ok to refill,  printed rx  

## 2015-03-26 ENCOUNTER — Inpatient Hospital Stay: Payer: Medicare Other

## 2015-03-26 VITALS — BP 111/50 | HR 80 | Temp 96.9°F | Resp 18

## 2015-03-26 DIAGNOSIS — C7951 Secondary malignant neoplasm of bone: Secondary | ICD-10-CM | POA: Diagnosis not present

## 2015-03-26 DIAGNOSIS — F419 Anxiety disorder, unspecified: Secondary | ICD-10-CM | POA: Diagnosis not present

## 2015-03-26 DIAGNOSIS — M818 Other osteoporosis without current pathological fracture: Secondary | ICD-10-CM | POA: Diagnosis not present

## 2015-03-26 DIAGNOSIS — D6959 Other secondary thrombocytopenia: Secondary | ICD-10-CM | POA: Diagnosis not present

## 2015-03-26 DIAGNOSIS — Z87891 Personal history of nicotine dependence: Secondary | ICD-10-CM | POA: Diagnosis not present

## 2015-03-26 DIAGNOSIS — D649 Anemia, unspecified: Secondary | ICD-10-CM | POA: Diagnosis not present

## 2015-03-26 DIAGNOSIS — C3491 Malignant neoplasm of unspecified part of right bronchus or lung: Secondary | ICD-10-CM

## 2015-03-26 DIAGNOSIS — C787 Secondary malignant neoplasm of liver and intrahepatic bile duct: Secondary | ICD-10-CM | POA: Diagnosis not present

## 2015-03-26 DIAGNOSIS — R42 Dizziness and giddiness: Secondary | ICD-10-CM | POA: Diagnosis not present

## 2015-03-26 DIAGNOSIS — Z85820 Personal history of malignant melanoma of skin: Secondary | ICD-10-CM | POA: Diagnosis not present

## 2015-03-26 DIAGNOSIS — G8929 Other chronic pain: Secondary | ICD-10-CM | POA: Diagnosis not present

## 2015-03-26 DIAGNOSIS — Z79899 Other long term (current) drug therapy: Secondary | ICD-10-CM | POA: Diagnosis not present

## 2015-03-26 DIAGNOSIS — C3490 Malignant neoplasm of unspecified part of unspecified bronchus or lung: Secondary | ICD-10-CM | POA: Diagnosis not present

## 2015-03-26 DIAGNOSIS — Z5111 Encounter for antineoplastic chemotherapy: Secondary | ICD-10-CM | POA: Diagnosis not present

## 2015-03-26 DIAGNOSIS — R1310 Dysphagia, unspecified: Secondary | ICD-10-CM | POA: Diagnosis not present

## 2015-03-26 DIAGNOSIS — I10 Essential (primary) hypertension: Secondary | ICD-10-CM | POA: Diagnosis not present

## 2015-03-26 DIAGNOSIS — IMO0001 Reserved for inherently not codable concepts without codable children: Secondary | ICD-10-CM

## 2015-03-26 DIAGNOSIS — E785 Hyperlipidemia, unspecified: Secondary | ICD-10-CM | POA: Diagnosis not present

## 2015-03-26 DIAGNOSIS — M549 Dorsalgia, unspecified: Secondary | ICD-10-CM | POA: Diagnosis not present

## 2015-03-26 DIAGNOSIS — C781 Secondary malignant neoplasm of mediastinum: Secondary | ICD-10-CM | POA: Diagnosis not present

## 2015-03-26 DIAGNOSIS — R531 Weakness: Secondary | ICD-10-CM | POA: Diagnosis not present

## 2015-03-26 DIAGNOSIS — E780 Pure hypercholesterolemia: Secondary | ICD-10-CM | POA: Diagnosis not present

## 2015-03-26 LAB — CBC WITH DIFFERENTIAL/PLATELET
BASOS ABS: 0 10*3/uL (ref 0–0.1)
Basophils Relative: 1 %
EOS PCT: 0 %
Eosinophils Absolute: 0 10*3/uL (ref 0–0.7)
HEMATOCRIT: 34.4 % — AB (ref 35.0–47.0)
Hemoglobin: 11.1 g/dL — ABNORMAL LOW (ref 12.0–16.0)
LYMPHS PCT: 36 %
Lymphs Abs: 1.3 10*3/uL (ref 1.0–3.6)
MCH: 29 pg (ref 26.0–34.0)
MCHC: 32.2 g/dL (ref 32.0–36.0)
MCV: 90 fL (ref 80.0–100.0)
MONO ABS: 0.6 10*3/uL (ref 0.2–0.9)
MONOS PCT: 16 %
Neutro Abs: 1.6 10*3/uL (ref 1.4–6.5)
Neutrophils Relative %: 47 %
Platelets: 123 10*3/uL — ABNORMAL LOW (ref 150–440)
RBC: 3.82 MIL/uL (ref 3.80–5.20)
RDW: 16 % — AB (ref 11.5–14.5)
WBC: 3.5 10*3/uL — ABNORMAL LOW (ref 3.6–11.0)

## 2015-03-26 MED ORDER — SODIUM CHLORIDE 0.9 % IV SOLN
Freq: Once | INTRAVENOUS | Status: AC
  Administered 2015-03-26: 11:00:00 via INTRAVENOUS
  Filled 2015-03-26: qty 1000

## 2015-03-26 MED ORDER — TOPOTECAN HCL CHEMO INJECTION 4 MG
3.0000 mg/m2 | Freq: Once | INTRAVENOUS | Status: AC
  Administered 2015-03-26: 4.9 mg via INTRAVENOUS
  Filled 2015-03-26: qty 4.9

## 2015-03-26 MED ORDER — SODIUM CHLORIDE 0.9 % IV SOLN
Freq: Once | INTRAVENOUS | Status: AC
  Administered 2015-03-26: 11:00:00 via INTRAVENOUS
  Filled 2015-03-26: qty 4

## 2015-03-26 NOTE — Telephone Encounter (Signed)
Unable to find Rx.

## 2015-03-26 NOTE — Telephone Encounter (Signed)
I spoke with patient & she states that she no longer needs this Rx. She will be getting this medication from Dr. Oliva Bustard.

## 2015-03-26 NOTE — Progress Notes (Signed)
I spoke to pt about results of scan and told pt that dr pandit plan was to ask dr Baruch Gouty the radiation doctor about radiating the areas of most pain in the back.  He is on vacation until Monday and the covering radiation doctor would rather dr Baruch Gouty make decision. I will let her know on Monday.

## 2015-03-27 ENCOUNTER — Other Ambulatory Visit: Payer: Self-pay | Admitting: Internal Medicine

## 2015-03-27 ENCOUNTER — Telehealth: Payer: Self-pay | Admitting: *Deleted

## 2015-03-27 DIAGNOSIS — C3491 Malignant neoplasm of unspecified part of right bronchus or lung: Secondary | ICD-10-CM

## 2015-03-27 DIAGNOSIS — IMO0001 Reserved for inherently not codable concepts without codable children: Secondary | ICD-10-CM

## 2015-03-27 MED ORDER — ONDANSETRON HCL 4 MG PO TABS: 4.0000 mg | ORAL_TABLET | ORAL | Status: AC | PRN

## 2015-03-27 NOTE — Progress Notes (Signed)
[facility]  Telephone:([phone]) [phone] Fax:(336) [phone]     ID: Nancy Doyle OB: 09/18/1934  MR#: [id number]  KCM#:[id number]  Patient Care Team: Crecencio Mc, MD as PCP - General (Internal Medicine)  CHIEF COMPLAINT/DIAGNOSIS:  Metastatic stage IV small cell right lung cancer, with metastases to mediastinum, liver, paraspinal T9 area. (presented with multiple liver lesions on CT scan of abdomen/pelvis on 06/02/14 which also reported wall thickening at the anorectal junction; wall thickening in the sigmoid colon; slight wall thickening in the stomach, several lesions in the upper spleen, as well as a nodule at the right lung base, paraspinal nodules on both sides of the T9 vertebral body could be from extra medullary hematopoiesis or malignancy, chronic compression fracture at L1 with chronic grade 1 retrolisthesis at L2-3, aortoiliac atherosclerosis with infrarenal abdominal aortic ectasia. 06/04/14 - serum CEA is 80.7, serum CA-125 is 78.  Started palliative chemotherapy with Carboplatin/Etoposide on 06/25/14. Got cycle 7 on April 11-13, 2016.    HISTORY OF PRESENT ILLNESS:  Patient came for lab monitoring, seen as work in appointment. She complains of constant mid back pain, mostly localized around lower thoracic area. Denies any radiation of pain, denies any new lower extremity weakness or fecal/urinary incontinence or retention. States that chronic  fatigue and dyspnea on exertion is same. Eating steady.    REVIEW OF SYSTEMS:   ROS As in HPI above. In addition, no fever, chills or sweats. No new headaches or focal weakness.  No abdominal pain, diarrhea, dysuria or hematuria. No new paresthesias in extremities.    PAST MEDICAL HISTORY: Reviewed. Past Medical History  Diagnosis Date  . GI bleed Nov 2009    3  ulcers found, required transfusion, repeat endoscopy showed mild gastritis  . Hypercholesterolemia     refuses statin therapy  . Diverticulitis 2005   hospitalized  . Hypertension   . Osteoporosis   . Tobacco abuse   . Tobacco abuse counseling   . Cancer           Hypertension  Chronic back pain  Generalized anxiety disorder  Diverticulosis  History of upper GI bleed  Hyperlipidemia  Osteoporosis  Glaucoma  Cataract extraction  History of melanoma at age 24  PAST SURGICAL HISTORY: Reviewed. Past Surgical History  Procedure Laterality Date  . Incontinence surgery  2003    Dr. Davis Gourd  . Appendectomy  Age 70  . Cataract extraction  Jan 2011    Dr. Wallace Going   FAMILY HISTORY: Reviewed. Noncontributory, denies malignancy.  SOCIAL HISTORY: Reviewed. Social History  Substance Use Topics  . Smoking status: Former Smoker -- 0.50 packs/day for 15 years    Types: Cigarettes  . Smokeless tobacco: Never Used     Comment: Quit October 2015  . Alcohol Use: No    Allergies  Allergen Reactions  . Penicillins Rash    Current Outpatient Prescriptions  Medication Sig Dispense Refill  . ALPRAZolam (XANAX) 0.5 MG tablet Take 1 tablet (0.5 mg total) by mouth 2 (two) times daily as needed. 60 tablet 3  . amLODipine (NORVASC) 10 MG tablet TAKE 1 TABLET (10 MG TOTAL) BY MOUTH DAILY. 30 tablet 5  . docusate sodium (COLACE) 100 MG capsule Take 100 mg by mouth 2 (two) times daily.    Marland Kitchen latanoprost (XALATAN) 0.005 % ophthalmic solution Place 1 drop into both eyes at bedtime.    . metoCLOPramide (REGLAN) 10 MG tablet Take 10 mg by mouth 3 (three) times daily before meals.     Marland Kitchen  mirtazapine (REMERON) 15 MG tablet Take 1 tablet (15 mg total) by mouth at bedtime. 30 tablet 3  . multivitamin-iron-minerals-folic acid (CENTRUM) chewable tablet Chew 1 tablet by mouth daily.      . pantoprazole (PROTONIX) 40 MG tablet TAKE 1 TABLET BY MOUTH TWICE A DAY 60 tablet 3  . potassium chloride SA (K-DUR,KLOR-CON) 20 MEQ tablet Take 20 mEq by mouth once.     . senna-docusate (SENOKOT-S) 8.6-50 MG per tablet Take 1 tablet by mouth at bedtime.    Marland Kitchen  venlafaxine XR (EFFEXOR-XR) 37.5 MG 24 hr capsule Take 37.5 mg by mouth daily.  3  . HYDROcodone-acetaminophen (NORCO) 10-325 MG per tablet Take 1 tablet by mouth every 6 (six) hours as needed. 120 tablet 0  . ondansetron (ZOFRAN) 4 MG tablet Take 1 tablet (4 mg total) by mouth every 4 (four) hours as needed for nausea or vomiting. 90 tablet 1   No current facility-administered medications for this visit.    PHYSICAL EXAM: There were no vitals filed for this visit.   There is no weight on file to calculate BMI.    ECOG FS:2 - Symptomatic, <50% confined to bed  GENERAL: Patient is alert and oriented and in no acute distress. There is no icterus. LUNGS: Bilaterally good air entry, no rhonchi. ABDOMEN: Soft, nontender.    NEURO: Grossly nonfocal, gait is slow but unremarkable.   LAB RESULTS:    Component Value Date/Time   NA 138 03/19/2015 0952   NA 139 12/11/2014 1018   NA 135* 06/30/2014   K 3.7 03/19/2015 0952   K 3.5 12/11/2014 1018   CL 101 03/19/2015 0952   CL 102 12/11/2014 1018   CO2 28 03/19/2015 0952   CO2 23 12/11/2014 1018   GLUCOSE 107* 03/19/2015 0952   GLUCOSE 133* 12/11/2014 1018   BUN 18 03/19/2015 0952   BUN 14 12/11/2014 1018   BUN 15 06/30/2014   CREATININE 0.64 03/19/2015 0952   CREATININE 0.76 12/11/2014 1018   CREATININE 0.6 06/30/2014   CREATININE 0.66 11/29/2011 1043   CALCIUM 9.4 03/19/2015 0952   CALCIUM 8.9 12/11/2014 1018   PROT 7.7 03/19/2015 0952   PROT 7.8 12/11/2014 1018   ALBUMIN 4.3 03/19/2015 0952   ALBUMIN 4.4 12/11/2014 1018   AST 23 03/19/2015 0952   AST 20 12/11/2014 1018   ALT 16 03/19/2015 0952   ALT 12* 12/11/2014 1018   ALKPHOS 114 03/19/2015 0952   ALKPHOS 83 12/11/2014 1018   BILITOT 0.6 03/19/2015 0952   BILITOT 0.6 12/11/2014 1018   GFRNONAA >60 03/19/2015 0952   GFRNONAA >60 12/11/2014 1018   GFRNONAA 87 11/29/2011 1043   GFRAA >60 03/19/2015 0952   GFRAA >60 12/11/2014 1018   GFRAA >89 11/29/2011 1043   Lab  Results  Component Value Date   WBC 3.5* 03/26/2015   NEUTROABS 1.6 03/26/2015   HGB 11.1* 03/26/2015   HCT 34.4* 03/26/2015   MCV 90.0 03/26/2015   PLT 123* 03/26/2015  ANC 600.   STUDIES: 06/02/14 - CT scan of abdomen/pelvis with contrast. IMPRESSION:  1. Innumerable masses in the liver highly suspicious for diffuse hepatic metastatic disease. A definite source is not identified, although candidate sources include the wall thickening at the anorectal junction; wall thickening in the sigmoid colon (which may be otherwise attributable to sigmoid colon diverticulosis) ; or less likely the slight wall thickening in the stomach. There also several lesions in the upper spleen, as well as a nodule at the right  lung base. Nuclear medicine PET-CT may help in further assessment and localization; otherwise, rectal exam and endoscopy may be important parts of the diagnostic workup.  2. Paraspinal nodules on both sides of the T9 vertebral body could be from extra medullary hematopoiesis or malignancy. 3. More gag knee hernia containing a considerable amount of omentum.  4. Chronic compression fracture at L1 with chronic grade 1 retrolisthesis at L2-3.  5. Aortoiliac atherosclerosis with infrarenal abdominal aortic ectasia.  06/04/14 - serum CEA is 80.7, serum CA-125 is 78.  09/22/14 - CT chest. IMPRESSION:  1. Response to therapy of thoracic nodal metastasis. 2. Although liver lesions are suboptimally evaluated secondary to noncontrast technique, felt to be improved. 3. Progression of T11 compression deformity with similar T8 and new T5 mild compression deformities. No dominant underlying osseous lesion is identified.  11/10/14 - CT chest without contrast.  IMPRESSION: 1. Similar to decreased size of thoracic lymph nodes. . Liver lesions are again suboptimally evaluated secondary to lack of IV contrast. Grossly similar to 09/22/14. Significantly improved since 08/04/14. 3. Increased right lower lobe nodularity.  Suspicious for interval infection, especially given the clinical history of cough.  4. No sites of progressive disease identified. 5. Cholelithiasis.  6.  Atherosclerosis, including within the coronary arteries.   ASSESSMENT / PLAN:   1. 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 Started palliative chemotherapy with carboplatin/etoposide on 06/25/14. Recent CT chest showed progression of malignancy. She is currently recently started on weekly Topotecan - patient having progressive mid back pain. Given history of compression fractures and metastatic lung cancer, will get MRI of thoracic spine. If she has progressive metastatic disease then could consider palliative radiation for pain control. Patient does not want to change pain medication at this time. She was advised to keep previously scheduled appointments the same.   2. Anemia - likely secondary to chemotherapy and underlying malignancy. No progressive anemia symptoms, fatigue is same. Continue to monitor and consider transfusion if it drops to less than 7 or progressive symptoms. 3. Thrombocytopenia - secondary to chemotherapy side effect. No bleeding issues. Continue to monitor. 4. Thoracic compression fracture, back pain -  plan is as above. Patient does not want to see Orthopedics at this time. In between visits, patient advised to call or come to ER in case of any fevers, bleeding, decreased appetite, new symptoms or acute sickness. She is agreeable to this plan.    Leia Alf, MD   03/27/2015 1:43 PM

## 2015-03-27 NOTE — Telephone Encounter (Signed)
Escribed

## 2015-03-30 ENCOUNTER — Telehealth: Payer: Self-pay | Admitting: Internal Medicine

## 2015-03-30 ENCOUNTER — Other Ambulatory Visit: Payer: Self-pay | Admitting: Internal Medicine

## 2015-03-30 DIAGNOSIS — IMO0001 Reserved for inherently not codable concepts without codable children: Secondary | ICD-10-CM

## 2015-03-30 DIAGNOSIS — C3491 Malignant neoplasm of unspecified part of right bronchus or lung: Secondary | ICD-10-CM

## 2015-03-30 MED ORDER — FENTANYL 12 MCG/HR TD PT72: 12.0000 ug | MEDICATED_PATCH | TRANSDERMAL | Status: DC

## 2015-03-30 NOTE — Telephone Encounter (Signed)
Called pt back and let her know that doctor has prescribed duragesic patch and she can have someone to come pick up, and it will not kick in for 15-19 hours. Then she can use the hydrocodone for breakthrough pain.  Also because pt taking another pain medicine so it can make it worse for constipation and she may need to take more stool softner and laxatives as needed with the additional pain med. Pt is ok with that. Also she can take the hydrocodone every 4 hours as needed. Pt agreeable to plan and will have DIL come and pick up rx in [place] office

## 2015-03-30 NOTE — Telephone Encounter (Signed)
She's in a lot of pain and said she needs hydrocodone Rx. She said Dr. Derrel Nip only prescribed one every 6 hours but she said Dr. Ma Hillock usually does every 4 and that is what she really needs to control her pain. She also said she thought Dr. Ma Hillock wanted her to meet with Dr. Baruch Gouty but she hasn't heard anything about it yet.

## 2015-03-30 NOTE — Telephone Encounter (Signed)
Got a call from Dr. Baruch Gouty and he said she would benefit from radiation and appt for wed 8/17 at 10 am.  I called pt and she is willing to come to  on this date. I then saw message from Mickel Baas about her pain medicine and called pt back to speak with her she states that she told Dr. Derrel Nip office last week that she is getting pain med from [place] now so they cancelled her rx on 8/10. Her last bottle was filled on 6/28 and she has about 15-20 left.  She wondered if she can take it every 4 hours and I told her that I would speak to [facility] and let her know but  With her hurting so much in her back is she ready for a long acting pain medicine and just use the hydrocodone for breakthrough pain and she is ready for it. Spoke to pandit and he will prescribe pain patch 12 mcg. And pt can have her hydrocodone every 4 hours if needed.  Called pt and  Did go over the increase risk of constipation and the possible needs for stool softner and poss.

## 2015-03-31 ENCOUNTER — Other Ambulatory Visit: Payer: Self-pay | Admitting: *Deleted

## 2015-03-31 MED ORDER — POTASSIUM CHLORIDE CRYS ER 20 MEQ PO TBCR: 20.0000 meq | EXTENDED_RELEASE_TABLET | Freq: Once | ORAL | Status: AC

## 2015-03-31 NOTE — Telephone Encounter (Signed)
Her last K+ level was at low normal on 8/4 of 3.7

## 2015-04-01 ENCOUNTER — Ambulatory Visit
Admission: RE | Admit: 2015-04-01 | Discharge: 2015-04-01 | Disposition: A | Discharge status: Home / Self Care | Payer: Medicare Other | Source: Ambulatory Visit | Attending: Radiation Oncology | Admitting: Radiation Oncology

## 2015-04-01 ENCOUNTER — Encounter: Payer: Self-pay | Admitting: Radiation Oncology

## 2015-04-01 ENCOUNTER — Other Ambulatory Visit: Payer: Self-pay | Admitting: *Deleted

## 2015-04-01 ENCOUNTER — Other Ambulatory Visit: Payer: Self-pay | Admitting: Internal Medicine

## 2015-04-01 VITALS — BP 105/62 | HR 96 | Temp 95.9°F | Resp 18

## 2015-04-01 DIAGNOSIS — IMO0001 Reserved for inherently not codable concepts without codable children: Secondary | ICD-10-CM

## 2015-04-01 DIAGNOSIS — C3491 Malignant neoplasm of unspecified part of right bronchus or lung: Secondary | ICD-10-CM

## 2015-04-01 DIAGNOSIS — C7951 Secondary malignant neoplasm of bone: Secondary | ICD-10-CM | POA: Diagnosis not present

## 2015-04-01 DIAGNOSIS — Z510 Encounter for antineoplastic radiation therapy: Secondary | ICD-10-CM | POA: Insufficient documentation

## 2015-04-01 MED ORDER — HYDROCODONE-ACETAMINOPHEN 10-325 MG PO TABS: 1.0000 | ORAL_TABLET | Freq: Four times a day (QID) | ORAL | Status: DC | PRN

## 2015-04-01 MED ORDER — MORPHINE SULFATE ER 15 MG PO TBCR: 15.0000 mg | EXTENDED_RELEASE_TABLET | Freq: Two times a day (BID) | ORAL | Status: DC

## 2015-04-01 MED ORDER — MEGESTROL ACETATE 400 MG/10ML PO SUSP: 800.0000 mg | Freq: Every day | ORAL | Status: DC

## 2015-04-01 MED ORDER — DRONABINOL 2.5 MG PO CAPS
2.5000 mg | ORAL_CAPSULE | Freq: Two times a day (BID) | ORAL | Status: AC
Start: 2015-04-01 — End: ?

## 2015-04-01 NOTE — Consult Note (Signed)
Except an outstanding is perfect of Radiation Oncology NEW PATIENT EVALUATION  Name: Nancy Doyle  MRN: [id number]  Date:   04/01/2015     DOB: 08-26-1934   This 80 y.o. female patient presents to the clinic for initial evaluation of stage IV small cell lung cancer of the right lung with thoracic spine involvement causing narcotic dependent pain.  REFERRING PHYSICIAN: Crecencio Mc, MD  CHIEF COMPLAINT:  Chief Complaint  Patient presents with  . Advice Only    Pt is here for initial consultation of metastatic lung cancer.    DIAGNOSIS: The encounter diagnosis was Bone metastases.   PREVIOUS INVESTIGATIONS:  CT scans and thoracic MRI scan reviewed Clinical notes reviewed Pathology report reviewed  HPI: Patient is a pleasant 80 year old female diagnosed back in October 2015 with stage IV small cell lung cancer with metastasis to mediastinum liver and spine. She is tolerating palliative chemotherapy well with carboplatinum etoposide under medical oncology's direction. She has been having increasing back pain and MRI scan of her thoracic spine showed widespread metastatic disease with pathologic compression fracture developing at T11 with significant retropulsion at that level with no cord compression or epidural involvement noted. She also has compression fractures at T9 and T7 as well as T10 and T12. She's having no trouble emulating old though did fall today bending her elbow with no evidence of obvious fracture. She does have a walker and I've encouraged her to use it is much is possible. She has no focal motor or sensory level. I been asked to evaluate the patient for possible palliative radiation therapy to her spine.  PLANNED TREATMENT REGIMEN: Palliative radiation therapy to thoracic spine T6-L1 inclusive.  PAST MEDICAL HISTORY:  has a past medical history of GI bleed (Nov 2009); Hypercholesterolemia; Diverticulitis (2005); Hypertension; Osteoporosis; Tobacco abuse; Tobacco  abuse counseling; and Cancer.    PAST SURGICAL HISTORY:  Past Surgical History  Procedure Laterality Date  . Incontinence surgery  2003    Dr. Davis Gourd  . Appendectomy  Age 50  . Cataract extraction  Jan 2011    Dr. Wallace Going    FAMILY HISTORY: family history is not on file.  SOCIAL HISTORY:  reports that she has quit smoking. Her smoking use included Cigarettes. She has a 7.5 pack-year smoking history. She has never used smokeless tobacco. She reports that she does not drink alcohol or use illicit drugs.  ALLERGIES: Penicillins  MEDICATIONS:  Current Outpatient Prescriptions  Medication Sig Dispense Refill  . ALPRAZolam (XANAX) 0.5 MG tablet Take 1 tablet (0.5 mg total) by mouth 2 (two) times daily as needed. 60 tablet 3  . amLODipine (NORVASC) 10 MG tablet TAKE 1 TABLET (10 MG TOTAL) BY MOUTH DAILY. 30 tablet 5  . docusate sodium (COLACE) 100 MG capsule Take 100 mg by mouth 2 (two) times daily.    Marland Kitchen latanoprost (XALATAN) 0.005 % ophthalmic solution Place 1 drop into both eyes at bedtime.    . metoCLOPramide (REGLAN) 10 MG tablet Take 10 mg by mouth 3 (three) times daily before meals.     . mirtazapine (REMERON) 15 MG tablet Take 1 tablet (15 mg total) by mouth at bedtime. 30 tablet 3  . multivitamin-iron-minerals-folic acid (CENTRUM) chewable tablet Chew 1 tablet by mouth daily.      . ondansetron (ZOFRAN) 4 MG tablet Take 1 tablet (4 mg total) by mouth every 4 (four) hours as needed for nausea or vomiting. 90 tablet 1  . pantoprazole (PROTONIX) 40 MG tablet TAKE 1  TABLET BY MOUTH TWICE A DAY 60 tablet 3  . potassium chloride SA (K-DUR,KLOR-CON) 20 MEQ tablet Take 1 tablet (20 mEq total) by mouth once. 30 tablet 2  . senna-docusate (SENOKOT-S) 8.6-50 MG per tablet Take 1 tablet by mouth at bedtime.    Marland Kitchen venlafaxine XR (EFFEXOR-XR) 37.5 MG 24 hr capsule Take 37.5 mg by mouth daily.  3  . dronabinol (MARINOL) 2.5 MG capsule Take 1 capsule (2.5 mg total) by mouth 2 (two) times  daily before a meal. 60 capsule 0  . HYDROcodone-acetaminophen (NORCO) 10-325 MG per tablet Take 1 tablet by mouth every 6 (six) hours as needed. 120 tablet 0  . morphine (MS CONTIN) 15 MG 12 hr tablet Take 1 tablet (15 mg total) by mouth every 12 (twelve) hours. 60 tablet 0   No current facility-administered medications for this encounter.    ECOG PERFORMANCE STATUS:  1 - Symptomatic but completely ambulatory  REVIEW OF SYSTEMS: Except for the back pain Patient denies any weight loss, fatigue, weakness, fever, chills or night sweats. Patient denies any loss of vision, blurred vision. Patient denies any ringing  of the ears or hearing loss. No irregular heartbeat. Patient denies heart murmur or history of fainting. Patient denies any chest pain or pain radiating to her upper extremities. Patient denies any shortness of breath, difficulty breathing at night, cough or hemoptysis. Patient denies any swelling in the lower legs. Patient denies any nausea vomiting, vomiting of blood, or coffee ground material in the vomitus. Patient denies any stomach pain. Patient states has had normal bowel movements no significant constipation or diarrhea. Patient denies any dysuria, hematuria or significant nocturia. Patient denies any problems walking, swelling in the joints or loss of balance. Patient denies any skin changes, loss of hair or loss of weight. Patient denies any excessive worrying or anxiety or significant depression. Patient denies any problems with insomnia. Patient denies excessive thirst, polyuria, polydipsia. Patient denies any swollen glands, patient denies easy bruising or easy bleeding. Patient denies any recent infections, allergies or URI. Patient "s visual fields have not changed significantly in recent time.    PHYSICAL EXAM: BP 105/62 mmHg  Pulse 96  Temp(Src) 95.9 F (35.5 C)  Resp 18  Wt  Well-developed wheelchair-bound elderly female in NAD. Motor sensory and DTR levels in the lower  extremities are equal and symmetric. Proprioception is intact. Deep palpation of her spine elicits pain around the T 11 region. Well-developed well-nourished patient in NAD. HEENT reveals PERLA, EOMI, discs not visualized.  Oral cavity is clear. No oral mucosal lesions are identified. Neck is clear without evidence of cervical or supraclavicular adenopathy. Lungs are clear to A&P. Cardiac examination is essentially unremarkable with regular rate and rhythm without murmur rub or thrill. Abdomen is benign with no organomegaly or masses noted. Motor sensory and DTR levels are equal and symmetric in the upper and lower extremities. Cranial nerves II through XII are grossly intact. Proprioception is intact. No peripheral adenopathy or edema is identified. No motor or sensory levels are noted. Crude visual fields are within normal range.   LABORATORY DATA: Pathology report reviewed    RADIOLOGY RESULTS: Thoracic MRI scan and CT scans reviewed   IMPRESSION: Metastatic stage IV small cell lung cancer with thoracic spine involvement in 80 year old female  PLAN: At this time like to go ahead with palliative radiation therapy to her thoracic spine. We'll treat T6-L1 inclusive to maximize amount of tumor involvement in her thoracic lumbar spine by my review  of her CT scans and thoracic MRI. Risks and benefits of treatment including skin reaction possible diarrhea, fatigue, alteration of blood counts, all were discussed in detail with the patient and her daughter-in-law. They both seem to compress my treatment plan well. I have set up and ordered CT simulation tomorrow. I will also coordinate with medical oncology possibly hold off any further chemotherapy to after completion of radiation to minimize her side effects.  I would like to take this opportunity for allowing me to participate in the care of your patient.Armstead Peaks., MD

## 2015-04-02 ENCOUNTER — Ambulatory Visit: Payer: Medicare Other | Admitting: Internal Medicine

## 2015-04-02 ENCOUNTER — Ambulatory Visit
Admission: RE | Admit: 2015-04-02 | Discharge: 2015-04-02 | Disposition: A | Discharge status: Home / Self Care | Payer: Medicare Other | Source: Ambulatory Visit | Attending: Radiation Oncology | Admitting: Radiation Oncology

## 2015-04-02 ENCOUNTER — Ambulatory Visit: Payer: Medicare Other

## 2015-04-02 ENCOUNTER — Other Ambulatory Visit: Payer: Medicare Other

## 2015-04-02 DIAGNOSIS — Z510 Encounter for antineoplastic radiation therapy: Secondary | ICD-10-CM | POA: Diagnosis not present

## 2015-04-02 DIAGNOSIS — C3491 Malignant neoplasm of unspecified part of right bronchus or lung: Secondary | ICD-10-CM | POA: Diagnosis not present

## 2015-04-02 DIAGNOSIS — C7951 Secondary malignant neoplasm of bone: Secondary | ICD-10-CM | POA: Diagnosis not present

## 2015-04-03 ENCOUNTER — Other Ambulatory Visit: Payer: Self-pay | Admitting: *Deleted

## 2015-04-03 DIAGNOSIS — Z510 Encounter for antineoplastic radiation therapy: Secondary | ICD-10-CM | POA: Diagnosis not present

## 2015-04-03 DIAGNOSIS — C3491 Malignant neoplasm of unspecified part of right bronchus or lung: Secondary | ICD-10-CM | POA: Diagnosis not present

## 2015-04-03 DIAGNOSIS — C7951 Secondary malignant neoplasm of bone: Secondary | ICD-10-CM | POA: Diagnosis not present

## 2015-04-06 ENCOUNTER — Ambulatory Visit
Admission: RE | Admit: 2015-04-06 | Discharge: 2015-04-06 | Disposition: A | Discharge status: Home / Self Care | Payer: Medicare Other | Source: Ambulatory Visit | Attending: Radiation Oncology | Admitting: Radiation Oncology

## 2015-04-06 DIAGNOSIS — C7951 Secondary malignant neoplasm of bone: Secondary | ICD-10-CM | POA: Diagnosis not present

## 2015-04-06 DIAGNOSIS — C3491 Malignant neoplasm of unspecified part of right bronchus or lung: Secondary | ICD-10-CM | POA: Diagnosis not present

## 2015-04-06 DIAGNOSIS — Z510 Encounter for antineoplastic radiation therapy: Secondary | ICD-10-CM | POA: Diagnosis not present

## 2015-04-07 ENCOUNTER — Ambulatory Visit
Admission: RE | Admit: 2015-04-07 | Discharge: 2015-04-07 | Disposition: A | Discharge status: Home / Self Care | Payer: Medicare Other | Source: Ambulatory Visit | Attending: Radiation Oncology | Admitting: Radiation Oncology

## 2015-04-07 DIAGNOSIS — C7951 Secondary malignant neoplasm of bone: Secondary | ICD-10-CM | POA: Diagnosis not present

## 2015-04-07 DIAGNOSIS — Z510 Encounter for antineoplastic radiation therapy: Secondary | ICD-10-CM | POA: Diagnosis not present

## 2015-04-07 DIAGNOSIS — C3491 Malignant neoplasm of unspecified part of right bronchus or lung: Secondary | ICD-10-CM | POA: Diagnosis not present

## 2015-04-08 ENCOUNTER — Ambulatory Visit
Admission: RE | Admit: 2015-04-08 | Discharge: 2015-04-08 | Disposition: A | Discharge status: Home / Self Care | Payer: Medicare Other | Source: Ambulatory Visit | Attending: Radiation Oncology | Admitting: Radiation Oncology

## 2015-04-08 ENCOUNTER — Ambulatory Visit: Payer: Medicare Other

## 2015-04-08 DIAGNOSIS — C7951 Secondary malignant neoplasm of bone: Secondary | ICD-10-CM | POA: Diagnosis not present

## 2015-04-08 DIAGNOSIS — Z510 Encounter for antineoplastic radiation therapy: Secondary | ICD-10-CM | POA: Diagnosis not present

## 2015-04-08 DIAGNOSIS — C3491 Malignant neoplasm of unspecified part of right bronchus or lung: Secondary | ICD-10-CM | POA: Diagnosis not present

## 2015-04-09 ENCOUNTER — Ambulatory Visit: Payer: Medicare Other

## 2015-04-09 ENCOUNTER — Ambulatory Visit
Admission: RE | Admit: 2015-04-09 | Discharge: 2015-04-09 | Disposition: A | Discharge status: Home / Self Care | Payer: Medicare Other | Source: Ambulatory Visit | Attending: Radiation Oncology | Admitting: Radiation Oncology

## 2015-04-09 DIAGNOSIS — C3491 Malignant neoplasm of unspecified part of right bronchus or lung: Secondary | ICD-10-CM | POA: Diagnosis not present

## 2015-04-09 DIAGNOSIS — C7951 Secondary malignant neoplasm of bone: Secondary | ICD-10-CM | POA: Diagnosis not present

## 2015-04-09 DIAGNOSIS — Z510 Encounter for antineoplastic radiation therapy: Secondary | ICD-10-CM | POA: Diagnosis not present

## 2015-04-10 ENCOUNTER — Ambulatory Visit: Payer: Medicare Other

## 2015-04-10 ENCOUNTER — Ambulatory Visit
Admission: RE | Admit: 2015-04-10 | Discharge: 2015-04-10 | Disposition: A | Discharge status: Home / Self Care | Payer: Medicare Other | Source: Ambulatory Visit | Attending: Radiation Oncology | Admitting: Radiation Oncology

## 2015-04-10 DIAGNOSIS — C3491 Malignant neoplasm of unspecified part of right bronchus or lung: Secondary | ICD-10-CM | POA: Diagnosis not present

## 2015-04-10 DIAGNOSIS — Z510 Encounter for antineoplastic radiation therapy: Secondary | ICD-10-CM | POA: Diagnosis not present

## 2015-04-10 DIAGNOSIS — C7951 Secondary malignant neoplasm of bone: Secondary | ICD-10-CM | POA: Diagnosis not present

## 2015-04-11 ENCOUNTER — Other Ambulatory Visit: Payer: Self-pay | Admitting: Internal Medicine

## 2015-04-13 ENCOUNTER — Ambulatory Visit: Payer: Medicare Other

## 2015-04-14 ENCOUNTER — Inpatient Hospital Stay: Payer: Medicare Other

## 2015-04-14 ENCOUNTER — Ambulatory Visit: Admission: RE | Admit: 2015-04-14 | Payer: Medicare Other | Source: Ambulatory Visit

## 2015-04-14 ENCOUNTER — Telehealth: Payer: Self-pay | Admitting: *Deleted

## 2015-04-14 ENCOUNTER — Ambulatory Visit: Payer: Medicare Other

## 2015-04-14 ENCOUNTER — Inpatient Hospital Stay (HOSPITAL_BASED_OUTPATIENT_CLINIC_OR_DEPARTMENT_OTHER): Payer: Medicare Other | Admitting: Internal Medicine

## 2015-04-14 VITALS — BP 115/79 | HR 106 | Temp 95.0°F | Resp 18 | Ht 62.0 in | Wt 125.9 lb

## 2015-04-14 DIAGNOSIS — E785 Hyperlipidemia, unspecified: Secondary | ICD-10-CM

## 2015-04-14 DIAGNOSIS — E780 Pure hypercholesterolemia: Secondary | ICD-10-CM | POA: Diagnosis not present

## 2015-04-14 DIAGNOSIS — Z85820 Personal history of malignant melanoma of skin: Secondary | ICD-10-CM | POA: Diagnosis not present

## 2015-04-14 DIAGNOSIS — Z87891 Personal history of nicotine dependence: Secondary | ICD-10-CM | POA: Diagnosis not present

## 2015-04-14 DIAGNOSIS — Z5111 Encounter for antineoplastic chemotherapy: Secondary | ICD-10-CM | POA: Diagnosis not present

## 2015-04-14 DIAGNOSIS — C7951 Secondary malignant neoplasm of bone: Secondary | ICD-10-CM

## 2015-04-14 DIAGNOSIS — C787 Secondary malignant neoplasm of liver and intrahepatic bile duct: Secondary | ICD-10-CM

## 2015-04-14 DIAGNOSIS — R42 Dizziness and giddiness: Secondary | ICD-10-CM | POA: Diagnosis not present

## 2015-04-14 DIAGNOSIS — M818 Other osteoporosis without current pathological fracture: Secondary | ICD-10-CM | POA: Diagnosis not present

## 2015-04-14 DIAGNOSIS — IMO0001 Reserved for inherently not codable concepts without codable children: Secondary | ICD-10-CM

## 2015-04-14 DIAGNOSIS — R531 Weakness: Secondary | ICD-10-CM | POA: Diagnosis not present

## 2015-04-14 DIAGNOSIS — Z5189 Encounter for other specified aftercare: Secondary | ICD-10-CM

## 2015-04-14 DIAGNOSIS — G8929 Other chronic pain: Secondary | ICD-10-CM | POA: Diagnosis not present

## 2015-04-14 DIAGNOSIS — R1310 Dysphagia, unspecified: Secondary | ICD-10-CM

## 2015-04-14 DIAGNOSIS — Z79899 Other long term (current) drug therapy: Secondary | ICD-10-CM | POA: Diagnosis not present

## 2015-04-14 DIAGNOSIS — D6959 Other secondary thrombocytopenia: Secondary | ICD-10-CM

## 2015-04-14 DIAGNOSIS — F419 Anxiety disorder, unspecified: Secondary | ICD-10-CM

## 2015-04-14 DIAGNOSIS — C781 Secondary malignant neoplasm of mediastinum: Secondary | ICD-10-CM | POA: Diagnosis not present

## 2015-04-14 DIAGNOSIS — C3491 Malignant neoplasm of unspecified part of right bronchus or lung: Secondary | ICD-10-CM

## 2015-04-14 DIAGNOSIS — M549 Dorsalgia, unspecified: Secondary | ICD-10-CM

## 2015-04-14 DIAGNOSIS — I10 Essential (primary) hypertension: Secondary | ICD-10-CM

## 2015-04-14 DIAGNOSIS — C3490 Malignant neoplasm of unspecified part of unspecified bronchus or lung: Secondary | ICD-10-CM | POA: Diagnosis not present

## 2015-04-14 DIAGNOSIS — D709 Neutropenia, unspecified: Secondary | ICD-10-CM

## 2015-04-14 DIAGNOSIS — D649 Anemia, unspecified: Secondary | ICD-10-CM | POA: Diagnosis not present

## 2015-04-14 LAB — BASIC METABOLIC PANEL
Anion gap: 13 (ref 5–15)
BUN: 14 mg/dL (ref 6–20)
CO2: 22 mmol/L (ref 22–32)
CREATININE: 0.91 mg/dL (ref 0.44–1.00)
Calcium: 8.4 mg/dL — ABNORMAL LOW (ref 8.9–10.3)
Chloride: 96 mmol/L — ABNORMAL LOW (ref 101–111)
GFR calc Af Amer: >60 mL/min (ref >60)
GFR, EST NON AFRICAN AMERICAN: 59 mL/min — AB (ref >60)
Glucose, Bld: 128 mg/dL — ABNORMAL HIGH (ref 65–99)
Potassium: 3.8 mmol/L (ref 3.5–5.1)
SODIUM: 131 mmol/L — AB (ref 135–145)

## 2015-04-14 LAB — CBC WITH DIFFERENTIAL/PLATELET
Basophils Absolute: 0 10*3/uL (ref 0–0.1)
Basophils Relative: 0 %
EOS ABS: 0 10*3/uL (ref 0–0.7)
EOS PCT: 0 %
HCT: 34.6 % — ABNORMAL LOW (ref 35.0–47.0)
Hemoglobin: 11.4 g/dL — ABNORMAL LOW (ref 12.0–16.0)
LYMPHS ABS: 0.5 10*3/uL — AB (ref 1.0–3.6)
Lymphocytes Relative: 13 %
MCH: 28.8 pg (ref 26.0–34.0)
MCHC: 33.1 g/dL (ref 32.0–36.0)
MCV: 87 fL (ref 80.0–100.0)
Monocytes Absolute: 0.8 10*3/uL (ref 0.2–0.9)
Monocytes Relative: 21 %
Neutro Abs: 2.6 10*3/uL (ref 1.4–6.5)
Neutrophils Relative %: 66 %
PLATELETS: 228 10*3/uL (ref 150–440)
RBC: 3.98 MIL/uL (ref 3.80–5.20)
RDW: 16.6 % — ABNORMAL HIGH (ref 11.5–14.5)
WBC: 3.9 10*3/uL (ref 3.6–11.0)

## 2015-04-14 LAB — SAMPLE TO BLOOD BANK

## 2015-04-14 MED ORDER — PREDNISONE 10 MG PO TABS: ORAL_TABLET | ORAL | Status: AC

## 2015-04-14 MED ORDER — SODIUM CHLORIDE 0.45 % IV SOLN
Freq: Once | INTRAVENOUS | Status: AC
  Administered 2015-04-14: 12:00:00 via INTRAVENOUS
  Filled 2015-04-14: qty 1000

## 2015-04-14 MED ORDER — SODIUM CHLORIDE 0.9 % IV SOLN
Freq: Once | INTRAVENOUS | Status: AC
  Administered 2015-04-14: 12:00:00 via INTRAVENOUS
  Filled 2015-04-14: qty 4

## 2015-04-14 NOTE — Progress Notes (Signed)
Patient states that she has been feeling very weak and is unable to eat much of anything. She states that her throat feels like it is closing up on her like it did when she was first dx'ed. She states that she has had a lot of nausea, but no vomiting. She has had an urge to vomit several times, but has not.

## 2015-04-14 NOTE — Telephone Encounter (Signed)
Called to ask if she can see md as she is too weak to come in for XRT and not feeling well. She has leg weakness, sob, anorexia, nausea, and feels dehydrated. I spoke with Dr Ma Hillock by phone and he said to have her come in for labs, see him and IVF. Appt agreed on for 10:15. Notified XRT that pt does not want tx today

## 2015-04-14 NOTE — Telephone Encounter (Signed)
Called in steroid taper to [facility] for pt.  Pt was told by pt that it will be called in for her.

## 2015-04-15 ENCOUNTER — Ambulatory Visit: Payer: Medicare Other

## 2015-04-15 ENCOUNTER — Ambulatory Visit
Admission: RE | Admit: 2015-04-15 | Discharge: 2015-04-15 | Disposition: A | Discharge status: Home / Self Care | Payer: Medicare Other | Source: Ambulatory Visit | Attending: Radiation Oncology | Admitting: Radiation Oncology

## 2015-04-15 DIAGNOSIS — C3491 Malignant neoplasm of unspecified part of right bronchus or lung: Secondary | ICD-10-CM | POA: Diagnosis not present

## 2015-04-15 DIAGNOSIS — C7951 Secondary malignant neoplasm of bone: Secondary | ICD-10-CM | POA: Diagnosis not present

## 2015-04-15 DIAGNOSIS — Z510 Encounter for antineoplastic radiation therapy: Secondary | ICD-10-CM | POA: Diagnosis not present

## 2015-04-16 ENCOUNTER — Ambulatory Visit
Admission: RE | Admit: 2015-04-16 | Discharge: 2015-04-16 | Disposition: A | Discharge status: Home / Self Care | Payer: Medicare Other | Source: Ambulatory Visit | Attending: Radiation Oncology | Admitting: Radiation Oncology

## 2015-04-16 ENCOUNTER — Other Ambulatory Visit: Payer: Self-pay | Admitting: *Deleted

## 2015-04-16 ENCOUNTER — Ambulatory Visit: Payer: Medicare Other

## 2015-04-16 DIAGNOSIS — C7800 Secondary malignant neoplasm of unspecified lung: Secondary | ICD-10-CM | POA: Diagnosis not present

## 2015-04-16 DIAGNOSIS — C3491 Malignant neoplasm of unspecified part of right bronchus or lung: Secondary | ICD-10-CM

## 2015-04-16 DIAGNOSIS — R1310 Dysphagia, unspecified: Secondary | ICD-10-CM | POA: Diagnosis not present

## 2015-04-16 DIAGNOSIS — IMO0001 Reserved for inherently not codable concepts without codable children: Secondary | ICD-10-CM

## 2015-04-16 DIAGNOSIS — C7951 Secondary malignant neoplasm of bone: Secondary | ICD-10-CM | POA: Diagnosis not present

## 2015-04-16 DIAGNOSIS — Z510 Encounter for antineoplastic radiation therapy: Secondary | ICD-10-CM | POA: Diagnosis not present

## 2015-04-17 ENCOUNTER — Inpatient Hospital Stay: Payer: Medicare Other | Attending: Radiation Oncology

## 2015-04-17 ENCOUNTER — Telehealth: Payer: Self-pay | Admitting: *Deleted

## 2015-04-17 ENCOUNTER — Ambulatory Visit
Admission: RE | Admit: 2015-04-17 | Discharge: 2015-04-17 | Disposition: A | Discharge status: Home / Self Care | Payer: Medicare Other | Source: Ambulatory Visit | Attending: Radiation Oncology | Admitting: Radiation Oncology

## 2015-04-17 ENCOUNTER — Ambulatory Visit: Payer: Medicare Other

## 2015-04-17 DIAGNOSIS — Z8711 Personal history of peptic ulcer disease: Secondary | ICD-10-CM | POA: Insufficient documentation

## 2015-04-17 DIAGNOSIS — M818 Other osteoporosis without current pathological fracture: Secondary | ICD-10-CM | POA: Insufficient documentation

## 2015-04-17 DIAGNOSIS — C787 Secondary malignant neoplasm of liver and intrahepatic bile duct: Secondary | ICD-10-CM | POA: Insufficient documentation

## 2015-04-17 DIAGNOSIS — Z9221 Personal history of antineoplastic chemotherapy: Secondary | ICD-10-CM | POA: Diagnosis not present

## 2015-04-17 DIAGNOSIS — Z79899 Other long term (current) drug therapy: Secondary | ICD-10-CM | POA: Insufficient documentation

## 2015-04-17 DIAGNOSIS — E780 Pure hypercholesterolemia: Secondary | ICD-10-CM | POA: Diagnosis not present

## 2015-04-17 DIAGNOSIS — Z87891 Personal history of nicotine dependence: Secondary | ICD-10-CM | POA: Insufficient documentation

## 2015-04-17 DIAGNOSIS — C3491 Malignant neoplasm of unspecified part of right bronchus or lung: Secondary | ICD-10-CM | POA: Insufficient documentation

## 2015-04-17 DIAGNOSIS — E785 Hyperlipidemia, unspecified: Secondary | ICD-10-CM | POA: Insufficient documentation

## 2015-04-17 DIAGNOSIS — K5790 Diverticulosis of intestine, part unspecified, without perforation or abscess without bleeding: Secondary | ICD-10-CM | POA: Diagnosis not present

## 2015-04-17 DIAGNOSIS — Z85820 Personal history of malignant melanoma of skin: Secondary | ICD-10-CM | POA: Insufficient documentation

## 2015-04-17 DIAGNOSIS — I2510 Atherosclerotic heart disease of native coronary artery without angina pectoris: Secondary | ICD-10-CM | POA: Insufficient documentation

## 2015-04-17 DIAGNOSIS — F419 Anxiety disorder, unspecified: Secondary | ICD-10-CM | POA: Insufficient documentation

## 2015-04-17 DIAGNOSIS — C7951 Secondary malignant neoplasm of bone: Secondary | ICD-10-CM | POA: Insufficient documentation

## 2015-04-17 DIAGNOSIS — C781 Secondary malignant neoplasm of mediastinum: Secondary | ICD-10-CM | POA: Insufficient documentation

## 2015-04-17 DIAGNOSIS — M549 Dorsalgia, unspecified: Secondary | ICD-10-CM | POA: Diagnosis not present

## 2015-04-17 DIAGNOSIS — I77811 Abdominal aortic ectasia: Secondary | ICD-10-CM | POA: Insufficient documentation

## 2015-04-17 DIAGNOSIS — I10 Essential (primary) hypertension: Secondary | ICD-10-CM | POA: Insufficient documentation

## 2015-04-17 DIAGNOSIS — Z510 Encounter for antineoplastic radiation therapy: Secondary | ICD-10-CM | POA: Diagnosis not present

## 2015-04-17 LAB — CBC
HCT: 34.9 % — ABNORMAL LOW (ref 35.0–47.0)
Hemoglobin: 11.4 g/dL — ABNORMAL LOW (ref 12.0–16.0)
MCH: 28.6 pg (ref 26.0–34.0)
MCHC: 32.6 g/dL (ref 32.0–36.0)
MCV: 87.6 fL (ref 80.0–100.0)
PLATELETS: 233 10*3/uL (ref 150–440)
RBC: 3.98 MIL/uL (ref 3.80–5.20)
RDW: 16.7 % — AB (ref 11.5–14.5)
WBC: 5.1 10*3/uL (ref 3.6–11.0)

## 2015-04-17 NOTE — Telephone Encounter (Signed)
Dr. Ma Hillock rcvd appt aboutpt from Dr. Tami Ribas stating thatr he did exam on pt (larngoscpy) and did not see anything to explain her dysphagia and rec: Dr. Vickii Penna to see her because she has had a stretching of esophagus in the past and maybe he could do EGD and decide if she has any issues that he can fix.  Pt. Is agreeable to having ref. Called GI and spoke to Butch Penny and she states she just had cancellation for 9/20 8 am.  I called pt and let her know and she will take the appt and I did ask if they get cancellation they will call her and move it up.  Pt states she is weak and hardly can take anything in.  Sometimes she can't even swallow water.  She is currently eating soft stuff but only a few bites and then she stops because it will come back and every couple of hours she takes a few more bites.  She tries a few sips of water or liq. But it is not going well.  I called and spoke to pandit and we can set her up for fluids on 9/6 and 9/7 for [place] but then I was told that she has radiation appt in the middle of the fluid appt.  I told pt that for her just go to [facility] on tues and then I will call radiation on tues am and see if they can do her radiation in afternoon. Pt agreeable to this.

## 2015-04-21 ENCOUNTER — Inpatient Hospital Stay: Payer: Medicare Other

## 2015-04-21 ENCOUNTER — Telehealth: Payer: Self-pay | Admitting: *Deleted

## 2015-04-21 ENCOUNTER — Ambulatory Visit
Admission: RE | Admit: 2015-04-21 | Discharge: 2015-04-21 | Disposition: A | Discharge status: Home / Self Care | Payer: Medicare Other | Source: Ambulatory Visit | Attending: Radiation Oncology | Admitting: Radiation Oncology

## 2015-04-21 ENCOUNTER — Ambulatory Visit: Payer: Medicare Other

## 2015-04-21 ENCOUNTER — Other Ambulatory Visit: Payer: Self-pay | Admitting: *Deleted

## 2015-04-21 ENCOUNTER — Inpatient Hospital Stay: Payer: Medicare Other | Attending: Internal Medicine

## 2015-04-21 VITALS — BP 123/61 | HR 95 | Temp 98.0°F | Resp 20

## 2015-04-21 DIAGNOSIS — C3491 Malignant neoplasm of unspecified part of right bronchus or lung: Secondary | ICD-10-CM | POA: Diagnosis not present

## 2015-04-21 DIAGNOSIS — Z510 Encounter for antineoplastic radiation therapy: Secondary | ICD-10-CM | POA: Diagnosis not present

## 2015-04-21 DIAGNOSIS — C7951 Secondary malignant neoplasm of bone: Secondary | ICD-10-CM | POA: Diagnosis not present

## 2015-04-21 DIAGNOSIS — IMO0001 Reserved for inherently not codable concepts without codable children: Secondary | ICD-10-CM

## 2015-04-21 MED ORDER — SODIUM CHLORIDE 0.45 % IV SOLN
Freq: Once | INTRAVENOUS | Status: AC
  Administered 2015-04-21: 09:00:00 via INTRAVENOUS
  Filled 2015-04-21: qty 1000

## 2015-04-21 NOTE — Telephone Encounter (Signed)
Called pt and informed her that I got GI appt for tom. It is at 2:30. Also told her we had to change her fluids to 2 hours due to she has to get radiation.  If she could be there at 8:45 in [facility] for fluids and 11:30 for radiation. She is agreeable to all and she will let me know if she had problems

## 2015-04-22 ENCOUNTER — Ambulatory Visit
Admission: RE | Admit: 2015-04-22 | Discharge: 2015-04-22 | Disposition: A | Discharge status: Home / Self Care | Payer: Medicare Other | Source: Ambulatory Visit | Attending: Radiation Oncology | Admitting: Radiation Oncology

## 2015-04-22 ENCOUNTER — Ambulatory Visit: Payer: Self-pay

## 2015-04-22 ENCOUNTER — Inpatient Hospital Stay: Payer: Medicare Other

## 2015-04-22 ENCOUNTER — Other Ambulatory Visit: Payer: Self-pay | Admitting: Internal Medicine

## 2015-04-22 VITALS — BP 103/42 | HR 98 | Temp 96.3°F | Resp 20

## 2015-04-22 DIAGNOSIS — IMO0001 Reserved for inherently not codable concepts without codable children: Secondary | ICD-10-CM

## 2015-04-22 DIAGNOSIS — Z510 Encounter for antineoplastic radiation therapy: Secondary | ICD-10-CM | POA: Diagnosis not present

## 2015-04-22 DIAGNOSIS — C3491 Malignant neoplasm of unspecified part of right bronchus or lung: Secondary | ICD-10-CM

## 2015-04-22 DIAGNOSIS — C7951 Secondary malignant neoplasm of bone: Secondary | ICD-10-CM | POA: Diagnosis not present

## 2015-04-22 DIAGNOSIS — R1314 Dysphagia, pharyngoesophageal phase: Secondary | ICD-10-CM | POA: Diagnosis not present

## 2015-04-22 MED ORDER — SODIUM CHLORIDE 0.45 % IV SOLN
INTRAVENOUS | Status: AC
  Administered 2015-04-22: 09:00:00 via INTRAVENOUS
  Filled 2015-04-22: qty 1000

## 2015-04-23 ENCOUNTER — Ambulatory Visit
Admission: RE | Admit: 2015-04-23 | Discharge: 2015-04-23 | Disposition: A | Discharge status: Home / Self Care | Payer: Medicare Other | Source: Ambulatory Visit | Attending: Radiation Oncology | Admitting: Radiation Oncology

## 2015-04-23 DIAGNOSIS — Z510 Encounter for antineoplastic radiation therapy: Secondary | ICD-10-CM | POA: Diagnosis not present

## 2015-04-23 DIAGNOSIS — C3491 Malignant neoplasm of unspecified part of right bronchus or lung: Secondary | ICD-10-CM | POA: Diagnosis not present

## 2015-04-23 DIAGNOSIS — C7951 Secondary malignant neoplasm of bone: Secondary | ICD-10-CM | POA: Diagnosis not present

## 2015-04-24 ENCOUNTER — Ambulatory Visit
Admission: RE | Admit: 2015-04-24 | Discharge: 2015-04-24 | Disposition: A | Discharge status: Home / Self Care | Payer: Medicare Other | Source: Ambulatory Visit | Attending: Internal Medicine | Admitting: Internal Medicine

## 2015-04-24 ENCOUNTER — Ambulatory Visit: Payer: Medicare Other

## 2015-04-24 ENCOUNTER — Ambulatory Visit: Admission: RE | Admit: 2015-04-24 | Payer: Medicare Other | Source: Ambulatory Visit

## 2015-04-24 ENCOUNTER — Other Ambulatory Visit: Payer: Self-pay | Admitting: Internal Medicine

## 2015-04-24 DIAGNOSIS — R918 Other nonspecific abnormal finding of lung field: Secondary | ICD-10-CM | POA: Insufficient documentation

## 2015-04-24 DIAGNOSIS — C787 Secondary malignant neoplasm of liver and intrahepatic bile duct: Secondary | ICD-10-CM | POA: Diagnosis not present

## 2015-04-24 DIAGNOSIS — C3490 Malignant neoplasm of unspecified part of unspecified bronchus or lung: Secondary | ICD-10-CM | POA: Insufficient documentation

## 2015-04-24 DIAGNOSIS — IMO0001 Reserved for inherently not codable concepts without codable children: Secondary | ICD-10-CM

## 2015-04-24 DIAGNOSIS — C773 Secondary and unspecified malignant neoplasm of axilla and upper limb lymph nodes: Secondary | ICD-10-CM | POA: Diagnosis not present

## 2015-04-24 DIAGNOSIS — C7801 Secondary malignant neoplasm of right lung: Secondary | ICD-10-CM | POA: Diagnosis not present

## 2015-04-24 DIAGNOSIS — C3491 Malignant neoplasm of unspecified part of right bronchus or lung: Secondary | ICD-10-CM

## 2015-04-24 MED ORDER — IOHEXOL 300 MG/ML  SOLN
75.0000 mL | Freq: Once | INTRAMUSCULAR | Status: AC | PRN
  Administered 2015-04-24: 75 mL via INTRAVENOUS

## 2015-04-24 NOTE — Progress Notes (Signed)
CT scan reports remarkable progression of recurrent/metastatic lung cancer. Given this, I have discussed report with patient over the phone since she has been having difficulty swallowing, and explained that her symptoms are likely secondary to progression of mediastinal masses. Have discussed further options including third line palliative chemotherapy like irinotecan versus other single agent treatment, and explained that the overall response rate is low given that she has had disease progression after 2 different kinds of chemotherapy, and that she is a poor candidate to tolerate treatment given poor performance status, and also discussed option of pursuing palliative care/hospice. Patient is leaning towards pursuing hospice but wants to try and complete palliative radiation, states that she has 5 more treatments left. Patient states that she is getting by with soft/liquid diet and does not see the need to emergently start hospice services over the weekend. She also does not see the need/benefit of pursuing feeding tube placement.  We will see her on Monday, September 12 when she comes for radiation treatment, discuss CT scan and then make hospice referral. She is agreeable to this plan.

## 2015-04-27 ENCOUNTER — Ambulatory Visit
Admission: RE | Admit: 2015-04-27 | Discharge: 2015-04-27 | Disposition: A | Discharge status: Home / Self Care | Payer: Medicare Other | Source: Ambulatory Visit | Attending: Radiation Oncology | Admitting: Radiation Oncology

## 2015-04-27 ENCOUNTER — Inpatient Hospital Stay (HOSPITAL_BASED_OUTPATIENT_CLINIC_OR_DEPARTMENT_OTHER): Payer: Medicare Other | Admitting: Internal Medicine

## 2015-04-27 VITALS — BP 115/77 | HR 109 | Temp 96.1°F | Resp 18 | Ht 62.0 in | Wt 121.7 lb

## 2015-04-27 DIAGNOSIS — C3490 Malignant neoplasm of unspecified part of unspecified bronchus or lung: Secondary | ICD-10-CM

## 2015-04-27 DIAGNOSIS — E785 Hyperlipidemia, unspecified: Secondary | ICD-10-CM | POA: Diagnosis not present

## 2015-04-27 DIAGNOSIS — Z9221 Personal history of antineoplastic chemotherapy: Secondary | ICD-10-CM | POA: Diagnosis not present

## 2015-04-27 DIAGNOSIS — I10 Essential (primary) hypertension: Secondary | ICD-10-CM

## 2015-04-27 DIAGNOSIS — I77811 Abdominal aortic ectasia: Secondary | ICD-10-CM

## 2015-04-27 DIAGNOSIS — Z8711 Personal history of peptic ulcer disease: Secondary | ICD-10-CM

## 2015-04-27 DIAGNOSIS — I2510 Atherosclerotic heart disease of native coronary artery without angina pectoris: Secondary | ICD-10-CM

## 2015-04-27 DIAGNOSIS — C7951 Secondary malignant neoplasm of bone: Secondary | ICD-10-CM | POA: Diagnosis not present

## 2015-04-27 DIAGNOSIS — M549 Dorsalgia, unspecified: Secondary | ICD-10-CM

## 2015-04-27 DIAGNOSIS — C3491 Malignant neoplasm of unspecified part of right bronchus or lung: Secondary | ICD-10-CM | POA: Diagnosis not present

## 2015-04-27 DIAGNOSIS — K5790 Diverticulosis of intestine, part unspecified, without perforation or abscess without bleeding: Secondary | ICD-10-CM

## 2015-04-27 DIAGNOSIS — E780 Pure hypercholesterolemia: Secondary | ICD-10-CM | POA: Diagnosis not present

## 2015-04-27 DIAGNOSIS — Z87891 Personal history of nicotine dependence: Secondary | ICD-10-CM | POA: Diagnosis not present

## 2015-04-27 DIAGNOSIS — C781 Secondary malignant neoplasm of mediastinum: Secondary | ICD-10-CM | POA: Diagnosis not present

## 2015-04-27 DIAGNOSIS — M818 Other osteoporosis without current pathological fracture: Secondary | ICD-10-CM

## 2015-04-27 DIAGNOSIS — Z79899 Other long term (current) drug therapy: Secondary | ICD-10-CM | POA: Diagnosis not present

## 2015-04-27 DIAGNOSIS — C787 Secondary malignant neoplasm of liver and intrahepatic bile duct: Secondary | ICD-10-CM

## 2015-04-27 DIAGNOSIS — F419 Anxiety disorder, unspecified: Secondary | ICD-10-CM | POA: Diagnosis not present

## 2015-04-27 DIAGNOSIS — Z85820 Personal history of malignant melanoma of skin: Secondary | ICD-10-CM

## 2015-04-27 DIAGNOSIS — Z510 Encounter for antineoplastic radiation therapy: Secondary | ICD-10-CM | POA: Diagnosis not present

## 2015-04-27 NOTE — Progress Notes (Signed)
Patient is here for follow-up of lung cancer. She states that her pain is a lot better, but she is still unable to eat.

## 2015-04-28 ENCOUNTER — Ambulatory Visit: Payer: Medicare Other

## 2015-04-28 ENCOUNTER — Telehealth: Payer: Self-pay | Admitting: *Deleted

## 2015-04-28 DIAGNOSIS — Z510 Encounter for antineoplastic radiation therapy: Secondary | ICD-10-CM | POA: Diagnosis not present

## 2015-04-28 DIAGNOSIS — C3491 Malignant neoplasm of unspecified part of right bronchus or lung: Secondary | ICD-10-CM | POA: Diagnosis not present

## 2015-04-28 DIAGNOSIS — C7951 Secondary malignant neoplasm of bone: Secondary | ICD-10-CM | POA: Diagnosis not present

## 2015-04-28 NOTE — Telephone Encounter (Signed)
Hospice ref. Made and pt will complete her last radiation treatment 9/16 and I have passed that info along with ref. To hospice and pt was in agreement for it.

## 2015-04-28 NOTE — Progress Notes (Signed)
[place]  [phone]) [phone] Fax:(336) [phone]     ID: BRITAINY KOZUB OB: 1935/03/30  MR#: [id number]  JYN#:[id number]  Patient Care Team: Crecencio Mc, MD as PCP - General (Internal Medicine)  CHIEF COMPLAINT/DIAGNOSIS:  Metastatic stage IV small cell right lung cancer, with metastases to mediastinum, liver, paraspinal T9 area. (presented with multiple liver lesions on CT scan of abdomen/pelvis on 06/02/14 which also reported wall thickening at the anorectal junction; wall thickening in the sigmoid colon; slight wall thickening in the stomach, several lesions in the upper spleen, as well as a nodule at the right lung base, paraspinal nodules on both sides of the T9 vertebral body could be from extra medullary hematopoiesis or malignancy, chronic compression fracture at L1 with chronic grade 1 retrolisthesis at L2-3, aortoiliac atherosclerosis with infrarenal abdominal aortic ectasia. 06/04/14 - serum CEA is 80.7, serum CA-125 is 78.  Initial palliative chemotherapy with Carboplatin/Etoposide (06/25/14 - 11/24/14). Then had disease progression, and received weekly topotecan.Marland Kitchen    HISTORY OF PRESENT ILLNESS:  Patient returns for continued oncology follow-up. She has been receiving palliative radiation for control of back pain, states that this is helping and has few more treatments left this week. She was seen by GI for dysphagia and recommended feeding tube. Patient therefore had CT scan which is showing progression of mediastinal masses and liver metastasis. Patient states that she is able to get down soft foods and some liquids. She has significant generalized weakness and is not doing much activity, mostly sitting or resting in bed. Denies any chest pain, hemoptysis. Cough and dyspnea on exertion is same. No fevers.    REVIEW OF SYSTEMS:   ROS As in HPI above. In addition, no fever, chills. No new headaches or focal weakness. No angina or palpitation.  No  abdominal pain, diarrhea, dysuria or hematuria. No new paresthesias in extremities. No polyuria or polydipsia. PS ECOG 3.    PAST MEDICAL HISTORY: Reviewed. Past Medical History  Diagnosis Date  . GI bleed Nov 2009    3  ulcers found, required transfusion, repeat endoscopy showed mild gastritis  . Hypercholesterolemia     refuses statin therapy  . Diverticulitis 2005    hospitalized  . Hypertension   . Osteoporosis   . Tobacco abuse   . Tobacco abuse counseling   . Cancer           Hypertension  Chronic back pain  Generalized anxiety disorder  Diverticulosis  History of upper GI bleed  Hyperlipidemia  Osteoporosis  Glaucoma  Cataract extraction  History of melanoma at age 53  PAST SURGICAL HISTORY: Reviewed. Past Surgical History  Procedure Laterality Date  . Incontinence surgery  2003    Dr. Davis Gourd  . Appendectomy  Age 80  . Cataract extraction  Jan 2011    Dr. Wallace Going   FAMILY HISTORY: Reviewed. Noncontributory, denies malignancy.  SOCIAL HISTORY: Reviewed. Social History  Substance Use Topics  . Smoking status: Former Smoker -- 0.50 packs/day for 15 years    Types: Cigarettes  . Smokeless tobacco: Never Used     Comment: Quit October 2015  . Alcohol Use: No    Allergies  Allergen Reactions  . Penicillins Rash    Current Outpatient Prescriptions  Medication Sig Dispense Refill  . HYDROcodone-acetaminophen (NORCO) 10-325 MG per tablet Take 1 tablet by mouth every 6 (six) hours as needed. 120 tablet 0  . latanoprost (XALATAN) 0.005 % ophthalmic solution Place 1 drop into both eyes at bedtime.    Marland Kitchen  metoCLOPramide (REGLAN) 10 MG tablet Take 10 mg by mouth 3 (three) times daily before meals.     . mirtazapine (REMERON) 15 MG tablet TAKE 1 TABLET (15 MG TOTAL) BY MOUTH AT BEDTIME. 30 tablet 3  . morphine (MS CONTIN) 15 MG 12 hr tablet Take 1 tablet (15 mg total) by mouth every 12 (twelve) hours. 60 tablet 0  . multivitamin-iron-minerals-folic acid  (CENTRUM) chewable tablet Chew 1 tablet by mouth daily.      . ondansetron (ZOFRAN) 4 MG tablet Take 1 tablet (4 mg total) by mouth every 4 (four) hours as needed for nausea or vomiting. 90 tablet 1  . pantoprazole (PROTONIX) 40 MG tablet TAKE 1 TABLET BY MOUTH TWICE A DAY 60 tablet 3  . potassium chloride SA (K-DUR,KLOR-CON) 20 MEQ tablet Take 1 tablet (20 mEq total) by mouth once. 30 tablet 2  . predniSONE (DELTASONE) 10 MG tablet Steroid taper starting with 4 tablets daily for 3 days, then 3 tablets daily for 3 days, then 2 tablets daily for 3 days then 10 mg daily for 3 days, then stop 30 tablet 0  . senna-docusate (SENOKOT-S) 8.6-50 MG per tablet Take 1 tablet by mouth at bedtime.    Marland Kitchen venlafaxine XR (EFFEXOR-XR) 37.5 MG 24 hr capsule Take 37.5 mg by mouth daily.  3  . ALPRAZolam (XANAX) 0.5 MG tablet Take 1 tablet (0.5 mg total) by mouth 2 (two) times daily as needed. 60 tablet 3  . amLODipine (NORVASC) 10 MG tablet TAKE 1 TABLET (10 MG TOTAL) BY MOUTH DAILY. 30 tablet 5  . docusate sodium (COLACE) 100 MG capsule Take 100 mg by mouth 2 (two) times daily.    Marland Kitchen dronabinol (MARINOL) 2.5 MG capsule Take 1 capsule (2.5 mg total) by mouth 2 (two) times daily before a meal. 60 capsule 0   No current facility-administered medications for this visit.    PHYSICAL EXAM: Filed Vitals:   04/27/15 1141  BP: 115/77  Pulse: 109  Temp: 96.1 F (35.6 C)  Resp: 18     Body mass index is 22.25 kg/(m^2).    ECOG FS:3 - Symptomatic, >50% confined to bed  GENERAL: Patient is weak looking, sitting in wheelchair, otherwise alert and oriented and in no acute distress. There is no icterus. HEENT: Extraocular movements intact. No oral thrush, mouth is moist. CVS: S1 and S2, regular LUNGS: Bilaterally good air entry, no rhonchi. ABDOMEN: Soft, nontender, liver is palpable.    EXTREMITIES: Trace edema   LAB RESULTS: Lab Results  Component Value Date   WBC 5.1 04/17/2015   NEUTROABS 2.6 04/14/2015   HGB  11.4* 04/17/2015   HCT 34.9* 04/17/2015   MCV 87.6 04/17/2015   PLT 233 04/17/2015     STUDIES: 06/02/14 - CT scan of abdomen/pelvis with contrast. IMPRESSION:  1. Innumerable masses in the liver highly suspicious for diffuse hepatic metastatic disease. A definite source is not identified, although candidate sources include the wall thickening at the anorectal junction; wall thickening in the sigmoid colon (which may be otherwise attributable to sigmoid colon diverticulosis) ; or less likely the slight wall thickening in the stomach. There also several lesions in the upper spleen, as well as a nodule at the right lung base. Nuclear medicine PET-CT may help in further assessment and localization; otherwise, rectal exam and endoscopy may be important parts of the diagnostic workup.  2. Paraspinal nodules on both sides of the T9 vertebral body could be from extra medullary hematopoiesis or malignancy. 3.  More gag knee hernia containing a considerable amount of omentum.  4. Chronic compression fracture at L1 with chronic grade 1 retrolisthesis at L2-3.  5. Aortoiliac atherosclerosis with infrarenal abdominal aortic ectasia.  06/04/14 - serum CEA is 80.7, serum CA-125 is 78.  09/22/14 - CT chest. IMPRESSION:  1. Response to therapy of thoracic nodal metastasis. 2. Although liver lesions are suboptimally evaluated secondary to noncontrast technique, felt to be improved. 3. Progression of T11 compression deformity with similar T8 and new T5 mild compression deformities. No dominant underlying osseous lesion is identified.  11/10/14 - CT chest without contrast.  IMPRESSION: 1. Similar to decreased size of thoracic lymph nodes. . Liver lesions are again suboptimally evaluated secondary to lack of IV contrast. Grossly similar to 09/22/14. Significantly improved since 08/04/14. 3. Increased right lower lobe nodularity. Suspicious for interval infection, especially given the clinical history of cough.  4. No sites  of progressive disease identified. 5. Cholelithiasis.  6.  Atherosclerosis, including within the coronary arteries.  04/24/15 - CT scan of the chest with contrast. IMPRESSION: 1. Marked progression of mediastinal and supraclavicular metastatic adenopathy on the RIGHT.  2. Newly enlarged metastatic adenopathy to the RIGHT axilla.  3. Stable small pulmonary nodules.  4. Marked progression of hepatic metastasis.  5. New compression deformity at T9 is concerning for pathologic fracture. Approximately 20% loss height without retropulsion.   ASSESSMENT / PLAN:   Metastatic stage IV small cell right lung cancer, with metastases to mediastinum, liver, paraspinal T9 area. Started palliative chemotherapy with carboplatin/etoposide on 06/25/14. Then progression of malignancy and was recently on weekly Topotecan - have reviewed CT scan of the chest from September 9 and discussed with patient and family of present, have explained that that is significant progression of metastatic lung cancer both as mediastinal masses which is likely causing her dysphagia to get worse, and extensive liver metastasis. Patient's performance status has declined, currently PS ECOG 3. Have discussed further options including pursuing single agent different chemotherapy which she would have difficulty tolerating given her poor performance status and nutrition, versus pursuing supportive care/hospice. Patient does not want to pursue further cancer treatment and is agreeable to proceed with home hospice, will make referral. Have discussed feeding tube issue, she also does not want to go through this and feels that it likely not be beneficial. She will complete palliative radiation for back pain by the end of this week, after which we will initiate home hospice. Patient and family understand that overall prognosis is likely extremely poor. Will see her back on an as needed basis otherwise.  In between visits, patient advised to call or come to ER  in case of any fevers, bleeding, new symptoms or acute sickness. She is agreeable to this plan.    Leia Alf, MD   04/28/2015 4:51 PM

## 2015-04-28 NOTE — Progress Notes (Signed)
[place]  Telephone:(336) [phone] Fax:(336) [phone]     ID: Nancy SCHMELZER OB: [birth year]  MR#: [id number]  FIE#:[id number]  Patient Care Team: Crecencio Mc, MD as PCP - General (Internal Medicine)  CHIEF COMPLAINT/DIAGNOSIS:  Metastatic stage IV small cell right lung cancer, with metastases to mediastinum, liver, paraspinal T9 area. (presented with multiple liver lesions on CT scan of abdomen/pelvis on 06/02/14 which also reported wall thickening at the anorectal junction; wall thickening in the sigmoid colon; slight wall thickening in the stomach, several lesions in the upper spleen, as well as a nodule at the right lung base, paraspinal nodules on both sides of the T9 vertebral body could be from extra medullary hematopoiesis or malignancy, chronic compression fracture at L1 with chronic grade 1 retrolisthesis at L2-3, aortoiliac atherosclerosis with infrarenal abdominal aortic ectasia. 06/04/14 - serum CEA is 80.7, serum CA-125 is 78.  Started palliative chemotherapy with Carboplatin/Etoposide on 06/25/14. Got cycle 7 on April 11-13, 2016. Then disease progression, and started weekly Topotecan.    HISTORY OF PRESENT ILLNESS:  Patient here for continued oncology follow-up. States that she is having vertigo which is causing problems with balance and ambulating. She denies any earache or discharge. Otherwise chronic weakness is same. She still has mid back pain and is getting palliative radiation. Denies any radiation of pain, denies any new lower extremity weakness or fecal/urinary incontinence or retention. States that she still has intermittent dysphagia to certain foods. No fever or chills.    REVIEW OF SYSTEMS:   ROS As in HPI above. In addition, no new headaches or focal weakness. No sore throat. Has dysphagia as above. No angina or palpitation.  No abdominal pain, diarrhea, dysuria or hematuria. No new paresthesias in extremities. No polyuria polydipsia. PS  ECOG 2-3.    PAST MEDICAL HISTORY: Reviewed. Past Medical History  Diagnosis Date  . GI bleed Nov 2009    3  ulcers found, required transfusion, repeat endoscopy showed mild gastritis  . Hypercholesterolemia     refuses statin therapy  . Diverticulitis 2005    hospitalized  . Hypertension   . Osteoporosis   . Tobacco abuse   . Tobacco abuse counseling   . Cancer           Hypertension  Chronic back pain  Generalized anxiety disorder  Diverticulosis  History of upper GI bleed  Hyperlipidemia  Osteoporosis  Glaucoma  Cataract extraction  History of melanoma at age 60  PAST SURGICAL HISTORY: Reviewed. Past Surgical History  Procedure Laterality Date  . Incontinence surgery  2003    Dr. Davis Gourd  . Appendectomy  Age 14  . Cataract extraction  Jan 2011    Dr. Wallace Going   FAMILY HISTORY: Reviewed. Noncontributory, denies malignancy.  SOCIAL HISTORY: Reviewed. Social History  Substance Use Topics  . Smoking status: Former Smoker -- 0.50 packs/day for 15 years    Types: Cigarettes  . Smokeless tobacco: Never Used     Comment: Quit October 2015  . Alcohol Use: No    Allergies  Allergen Reactions  . Penicillins Rash    Current Outpatient Prescriptions  Medication Sig Dispense Refill  . ALPRAZolam (XANAX) 0.5 MG tablet Take 1 tablet (0.5 mg total) by mouth 2 (two) times daily as needed. 60 tablet 3  . amLODipine (NORVASC) 10 MG tablet TAKE 1 TABLET (10 MG TOTAL) BY MOUTH DAILY. 30 tablet 5  . docusate sodium (COLACE) 100 MG capsule Take 100 mg by mouth 2 (two) times  daily.    . dronabinol (MARINOL) 2.5 MG capsule Take 1 capsule (2.5 mg total) by mouth 2 (two) times daily before a meal. 60 capsule 0  . HYDROcodone-acetaminophen (NORCO) 10-325 MG per tablet Take 1 tablet by mouth every 6 (six) hours as needed. 120 tablet 0  . latanoprost (XALATAN) 0.005 % ophthalmic solution Place 1 drop into both eyes at bedtime.    . metoCLOPramide (REGLAN) 10 MG tablet Take 10  mg by mouth 3 (three) times daily before meals.     . mirtazapine (REMERON) 15 MG tablet TAKE 1 TABLET (15 MG TOTAL) BY MOUTH AT BEDTIME. 30 tablet 3  . morphine (MS CONTIN) 15 MG 12 hr tablet Take 1 tablet (15 mg total) by mouth every 12 (twelve) hours. 60 tablet 0  . multivitamin-iron-minerals-folic acid (CENTRUM) chewable tablet Chew 1 tablet by mouth daily.      . ondansetron (ZOFRAN) 4 MG tablet Take 1 tablet (4 mg total) by mouth every 4 (four) hours as needed for nausea or vomiting. 90 tablet 1  . pantoprazole (PROTONIX) 40 MG tablet TAKE 1 TABLET BY MOUTH TWICE A DAY 60 tablet 3  . potassium chloride SA (K-DUR,KLOR-CON) 20 MEQ tablet Take 1 tablet (20 mEq total) by mouth once. 30 tablet 2  . senna-docusate (SENOKOT-S) 8.6-50 MG per tablet Take 1 tablet by mouth at bedtime.    Marland Kitchen venlafaxine XR (EFFEXOR-XR) 37.5 MG 24 hr capsule Take 37.5 mg by mouth daily.  3  . predniSONE (DELTASONE) 10 MG tablet Steroid taper starting with 4 tablets daily for 3 days, then 3 tablets daily for 3 days, then 2 tablets daily for 3 days then 10 mg daily for 3 days, then stop 30 tablet 0   No current facility-administered medications for this visit.    PHYSICAL EXAM: Filed Vitals:   04/14/15 1029  BP: 115/79  Pulse: 106  Temp: 95 F (35 C)  Resp: 18     Body mass index is 23.02 kg/(m^2).      GENERAL: Patient is alert and oriented and in no acute distress. No icterus. HEENT: Extraocular movements intact. No oral thrush. LUNGS: Bilaterally good air entry, no rhonchi. ABDOMEN: Soft, nontender.    NEURO: Grossly nonfocal, gait is slow but unremarkable.   LAB RESULTS:    Component Value Date/Time   NA 131* 04/14/2015 1022   NA 139 12/11/2014 1018   NA 135* 06/30/2014   K 3.8 04/14/2015 1022   K 3.5 12/11/2014 1018   CL 96* 04/14/2015 1022   CL 102 12/11/2014 1018   CO2 22 04/14/2015 1022   CO2 23 12/11/2014 1018   GLUCOSE 128* 04/14/2015 1022   GLUCOSE 133* 12/11/2014 1018   BUN 14  04/14/2015 1022   BUN 14 12/11/2014 1018   BUN 15 06/30/2014   CREATININE 0.91 04/14/2015 1022   CREATININE 0.76 12/11/2014 1018   CREATININE 0.6 06/30/2014   CREATININE 0.66 11/29/2011 1043   CALCIUM 8.4* 04/14/2015 1022   CALCIUM 8.9 12/11/2014 1018   PROT 7.7 03/19/2015 0952   PROT 7.8 12/11/2014 1018   ALBUMIN 4.3 03/19/2015 0952   ALBUMIN 4.4 12/11/2014 1018   AST 23 03/19/2015 0952   AST 20 12/11/2014 1018   ALT 16 03/19/2015 0952   ALT 12* 12/11/2014 1018   ALKPHOS 114 03/19/2015 0952   ALKPHOS 83 12/11/2014 1018   BILITOT 0.6 03/19/2015 0952   BILITOT 0.6 12/11/2014 1018   GFRNONAA 59* 04/14/2015 1022   GFRNONAA >60 12/11/2014 1018  GFRNONAA >60 09/15/2014 1011   GFRNONAA 87 11/29/2011 1043   GFRAA >60 04/14/2015 1022   GFRAA >60 12/11/2014 1018   GFRAA >60 09/15/2014 1011   GFRAA >89 11/29/2011 1043   Lab Results  Component Value Date   WBC 5.1 04/17/2015   NEUTROABS 2.6 04/14/2015   HGB 11.4* 04/17/2015   HCT 34.9* 04/17/2015   MCV 87.6 04/17/2015   PLT 233 04/17/2015  ANC 600.   STUDIES: 06/02/14 - CT scan of abdomen/pelvis with contrast. IMPRESSION:  1. Innumerable masses in the liver highly suspicious for diffuse hepatic metastatic disease. A definite source is not identified, although candidate sources include the wall thickening at the anorectal junction; wall thickening in the sigmoid colon (which may be otherwise attributable to sigmoid colon diverticulosis) ; or less likely the slight wall thickening in the stomach. There also several lesions in the upper spleen, as well as a nodule at the right lung base. Nuclear medicine PET-CT may help in further assessment and localization; otherwise, rectal exam and endoscopy may be important parts of the diagnostic workup.  2. Paraspinal nodules on both sides of the T9 vertebral body could be from extra medullary hematopoiesis or malignancy. 3. More gag knee hernia containing a considerable amount of omentum.  4.  Chronic compression fracture at L1 with chronic grade 1 retrolisthesis at L2-3.  5. Aortoiliac atherosclerosis with infrarenal abdominal aortic ectasia.  06/04/14 - serum CEA is 80.7, serum CA-125 is 78.  09/22/14 - CT chest. IMPRESSION:  1. Response to therapy of thoracic nodal metastasis. 2. Although liver lesions are suboptimally evaluated secondary to noncontrast technique, felt to be improved. 3. Progression of T11 compression deformity with similar T8 and new T5 mild compression deformities. No dominant underlying osseous lesion is identified.  11/10/14 - CT chest without contrast.  IMPRESSION: 1. Similar to decreased size of thoracic lymph nodes. . Liver lesions are again suboptimally evaluated secondary to lack of IV contrast. Grossly similar to 09/22/14. Significantly improved since 08/04/14. 3. Increased right lower lobe nodularity. Suspicious for interval infection, especially given the clinical history of cough.  4. No sites of progressive disease identified. 5. Cholelithiasis.  6.  Atherosclerosis, including within the coronary arteries.   ASSESSMENT / PLAN:   1. 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 Started palliative chemotherapy with carboplatin/etoposide on 06/25/14. Recent CT chest showed progression of malignancy. She is currently on weekly Topotecan - patient having progressive mid back pain, receiving palliative radiation. Topotecan is on hold per request from radiation oncology. On MS Contin 15 mg twice a day, Norco when necessary for breakthrough pain. She was advised to keep previously scheduled appointments the same.   2. Anemia - likely secondary to chemotherapy and underlying malignancy. No progressive anemia symptoms, fatigue is same. Continue to monitor and consider transfusion if it drops to less than 7 or progressive symptoms. 3. Thrombocytopenia - secondary to chemotherapy side effect. No bleeding  issues. Continue to monitor. 4. Vertigo - refer to ENT for evaluation and management. 5. In between visits, patient advised to call or come to ER in case of any fevers, bleeding, decreased appetite, new symptoms or acute sickness. She is agreeable to this plan.    Leia Alf, MD   04/28/2015 11:46 PM

## 2015-04-29 ENCOUNTER — Ambulatory Visit: Payer: Medicare Other

## 2015-04-29 DIAGNOSIS — Z510 Encounter for antineoplastic radiation therapy: Secondary | ICD-10-CM | POA: Diagnosis not present

## 2015-04-29 DIAGNOSIS — C7951 Secondary malignant neoplasm of bone: Secondary | ICD-10-CM | POA: Diagnosis not present

## 2015-04-29 DIAGNOSIS — C3491 Malignant neoplasm of unspecified part of right bronchus or lung: Secondary | ICD-10-CM | POA: Diagnosis not present

## 2015-04-30 ENCOUNTER — Ambulatory Visit
Admission: RE | Admit: 2015-04-30 | Discharge: 2015-04-30 | Disposition: A | Discharge status: Home / Self Care | Payer: Medicare Other | Source: Ambulatory Visit | Attending: Radiation Oncology | Admitting: Radiation Oncology

## 2015-04-30 ENCOUNTER — Ambulatory Visit: Payer: Medicare Other

## 2015-04-30 ENCOUNTER — Other Ambulatory Visit: Payer: Self-pay | Admitting: *Deleted

## 2015-04-30 DIAGNOSIS — C3491 Malignant neoplasm of unspecified part of right bronchus or lung: Secondary | ICD-10-CM

## 2015-04-30 DIAGNOSIS — C7951 Secondary malignant neoplasm of bone: Secondary | ICD-10-CM | POA: Diagnosis not present

## 2015-04-30 DIAGNOSIS — IMO0001 Reserved for inherently not codable concepts without codable children: Secondary | ICD-10-CM

## 2015-04-30 DIAGNOSIS — Z510 Encounter for antineoplastic radiation therapy: Secondary | ICD-10-CM | POA: Diagnosis not present

## 2015-04-30 NOTE — Telephone Encounter (Signed)
Requesting refill on effexor, MS, and Hydrocodone, states she can wait until tomorrow to get it

## 2015-05-01 ENCOUNTER — Other Ambulatory Visit: Payer: Self-pay | Admitting: Internal Medicine

## 2015-05-01 ENCOUNTER — Ambulatory Visit: Payer: Medicare Other

## 2015-05-01 DIAGNOSIS — C3491 Malignant neoplasm of unspecified part of right bronchus or lung: Secondary | ICD-10-CM

## 2015-05-01 DIAGNOSIS — IMO0001 Reserved for inherently not codable concepts without codable children: Secondary | ICD-10-CM

## 2015-05-01 MED ORDER — MORPHINE SULFATE ER 15 MG PO TBCR: 15.0000 mg | EXTENDED_RELEASE_TABLET | Freq: Two times a day (BID) | ORAL | Status: AC

## 2015-05-01 MED ORDER — VENLAFAXINE HCL ER 37.5 MG PO CP24: 37.5000 mg | ORAL_CAPSULE | Freq: Every day | ORAL | Status: AC

## 2015-05-01 MED ORDER — HYDROCODONE-ACETAMINOPHEN 10-325 MG PO TABS: 1.0000 | ORAL_TABLET | Freq: Four times a day (QID) | ORAL | Status: AC | PRN

## 2015-05-01 NOTE — Telephone Encounter (Signed)
Informed that prescription is ready to pick up  

## 2015-06-01 ENCOUNTER — Ambulatory Visit: Payer: Medicare Other | Attending: Radiation Oncology | Admitting: Radiation Oncology

## 2015-06-16 DEATH — deceased

## 2016-02-03 ENCOUNTER — Other Ambulatory Visit: Payer: Self-pay | Admitting: Nurse Practitioner

## 2016-03-04 IMAGING — CT CT HEAD WITHOUT CONTRAST
1 series · 16 of 27 positions shown, 20 images · non-contrast
Comparison: 06/27/2014.

CLINICAL DATA: Stage IV lung cancer. Undergoing chemotherapy,
generalized weakness with presyncope. Nausea.

EXAM:
CT HEAD WITHOUT CONTRAST
TECHNIQUE: Contiguous axial images were obtained from the base of the skull
through the vertex without intravenous contrast.

[Series 2: soft tissue · axial · 0.41mm/px · z∈[-224,-104]mm · 16 of 27 slices shown, 20 images]
[im 2/27  brain]
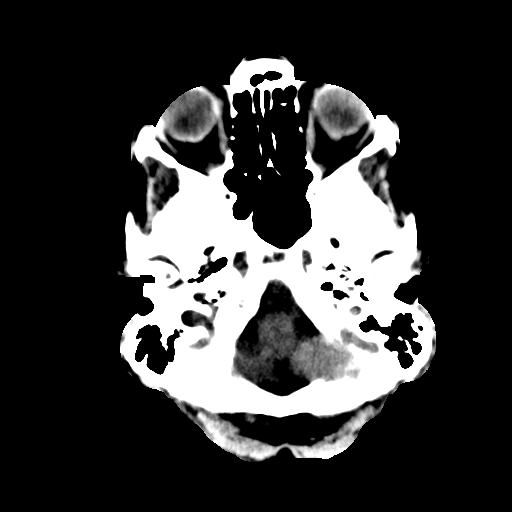
[im 2/27  bone]
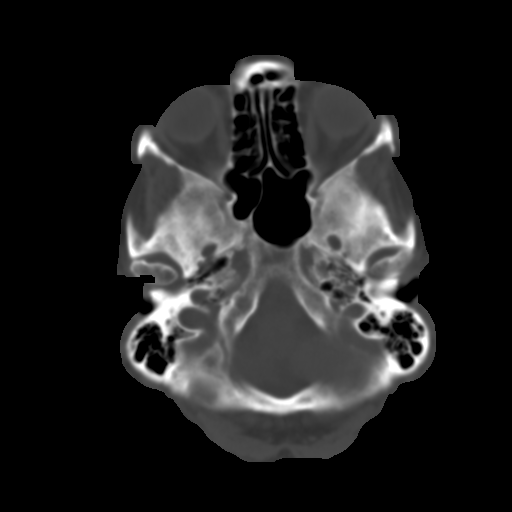
[im 4/27  brain]
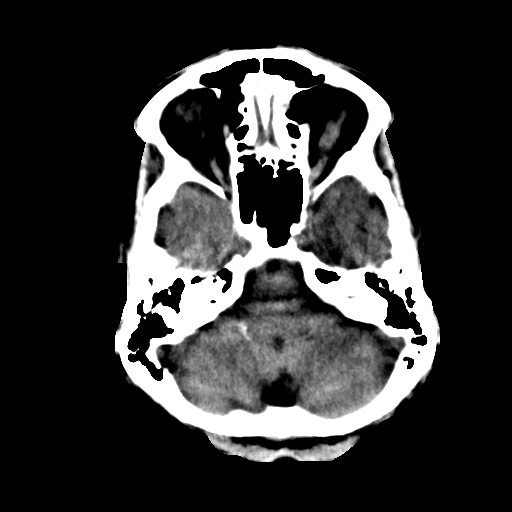
[im 5/27  brain]
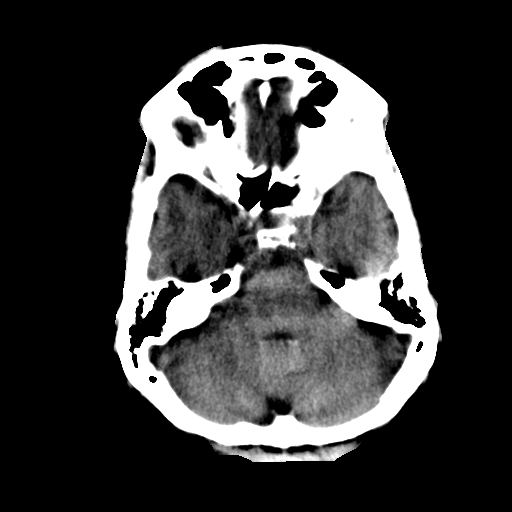
[im 7/27  brain]
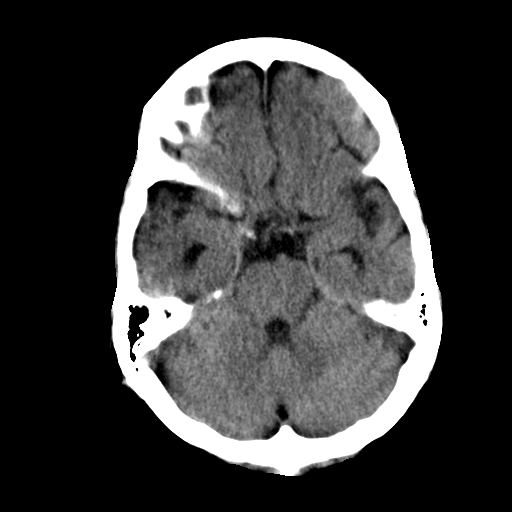
[im 9/27  brain]
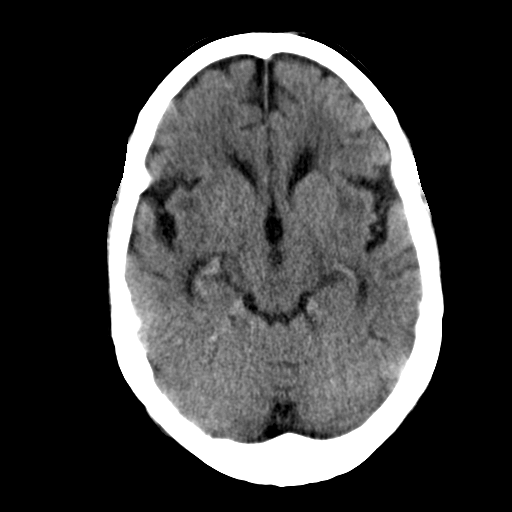
[im 9/27  bone]
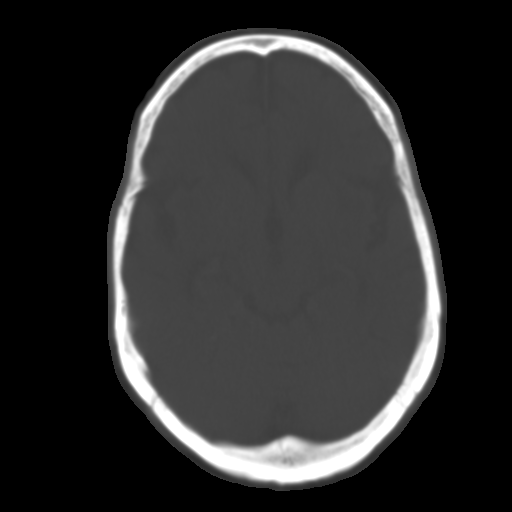
[im 10/27  brain]
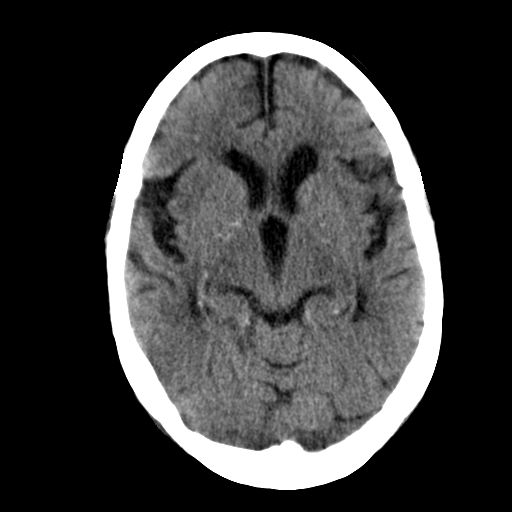
[im 12/27  brain]
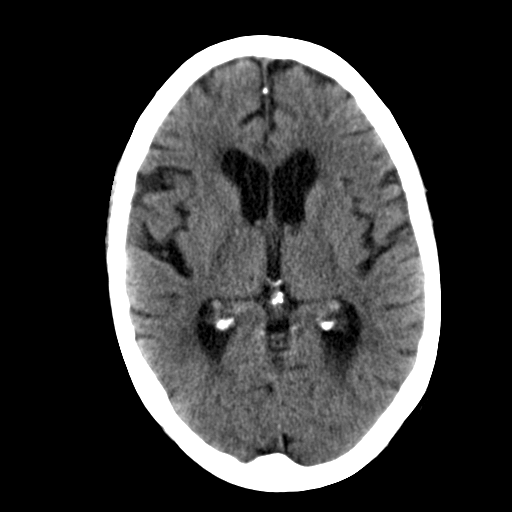
[im 13/27  brain]
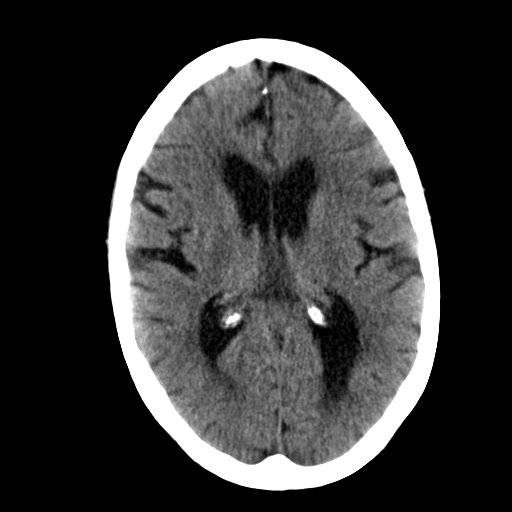
[im 15/27  brain]
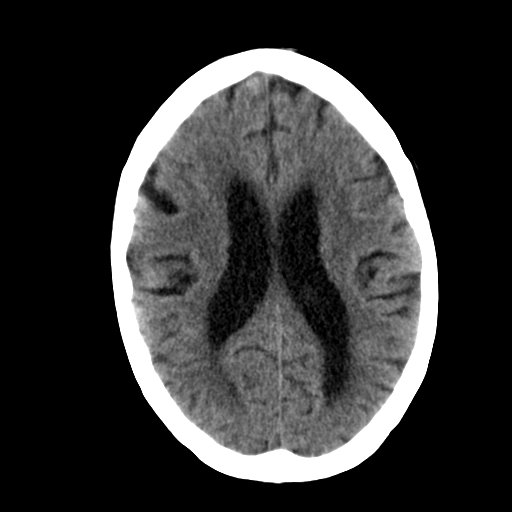
[im 15/27  bone]
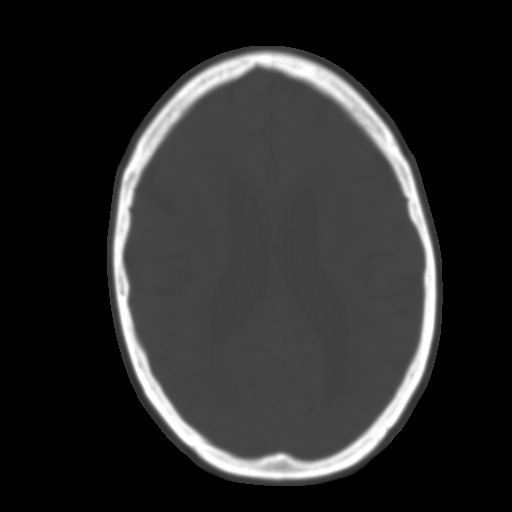
[im 16/27  brain]
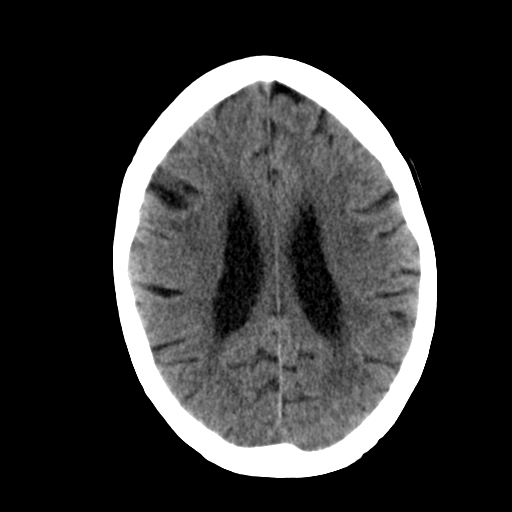
[im 18/27  brain]
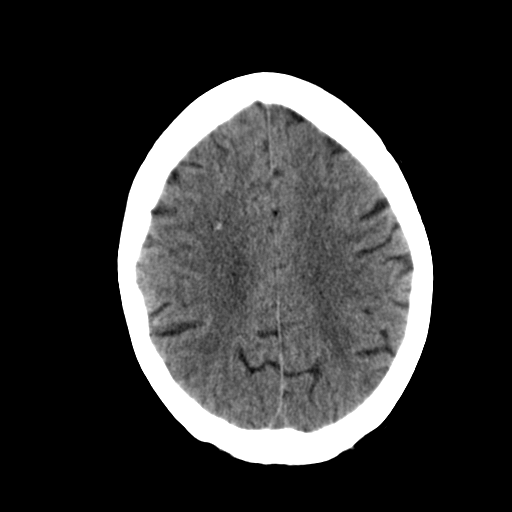
[im 19/27  brain]
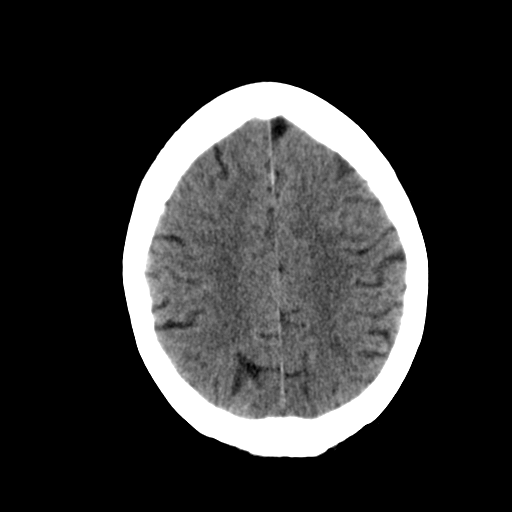
[im 21/27  brain]
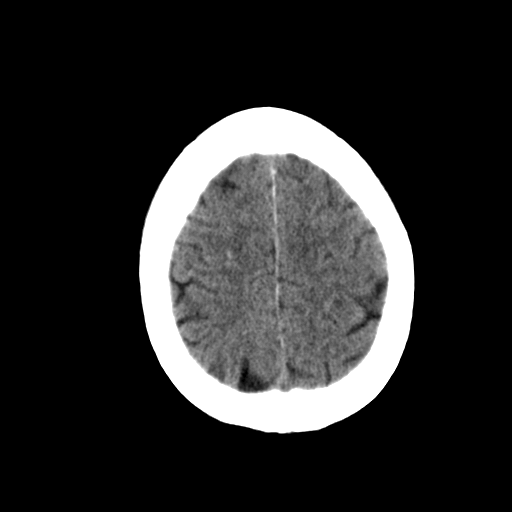
[im 21/27  bone]
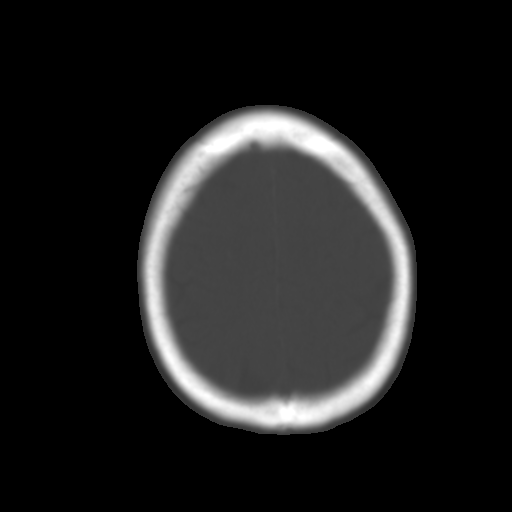
[im 23/27  brain]
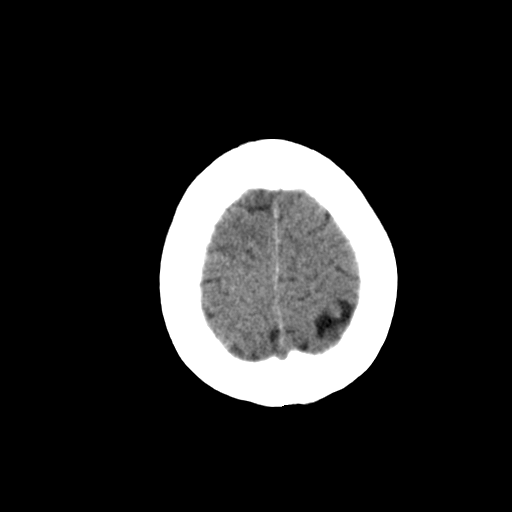
[im 24/27  brain]
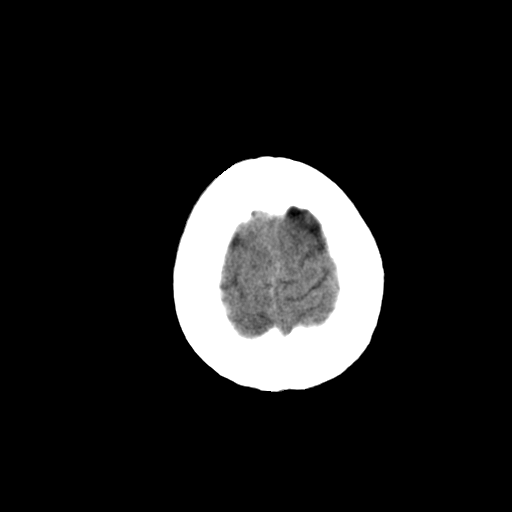
[im 26/27  brain]
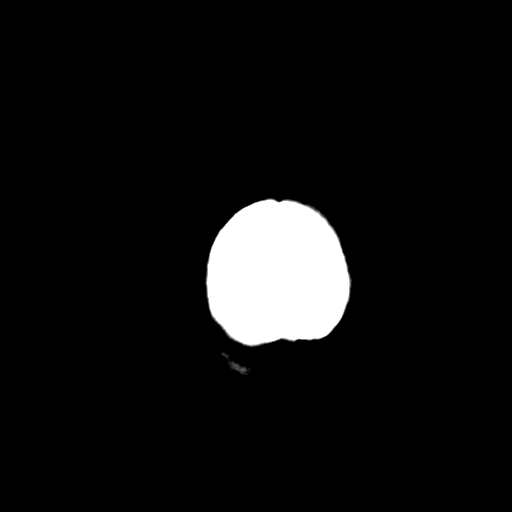

[16 of 27 positions shown; findings below may reference images not displayed]

FINDINGS: No evidence for acute infarction, hemorrhage, mass lesion,
hydrocephalus, or extra-axial fluid. Moderate cerebral and
cerebellar atrophy similar to priors. Chronic microvascular ischemic
change. Tiny RIGHT frontal parenchymal calcification is stable,
likely old traumatic or infectious insult. Vascular calcification.
No osseous lesions. No sinus or mastoid air fluid level. BILATERAL
cataract extraction.
IMPRESSION: Stable exam. No interval change. No visible intracranial mass
lesion.

## 2016-07-22 IMAGING — CT CT CHEST W/ CM
2 of 3 series · 16 of 46 positions shown, 18 images · IV contrast (omnipaque)
Comparison: Chest CT 02/10/2015 directed of there are

CLINICAL DATA: Restaging metastatic lung cancer on chemotherapy.
Subsequent treatment evaluation. Chemotherapy radiation therapy
ongoing.

EXAM:
CT CHEST WITH CONTRAST
TECHNIQUE: Multidetector CT imaging of the chest was performed during
intravenous contrast administration.
CONTRAST:  75mL OMNIPAQUE IOHEXOL 300 MG/ML  SOLN

[Series 2: routine chest with · axial · 0.65mm/px · z∈[+40,+300]mm · 13 of 60 slices shown, 15 images]
[im 4/60  soft-tissue]
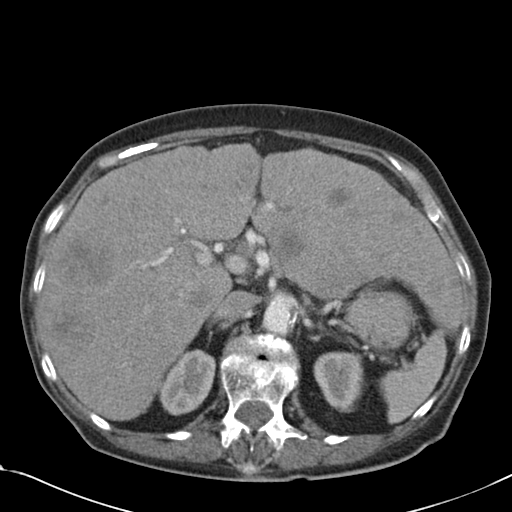
[im 4/60  bone]
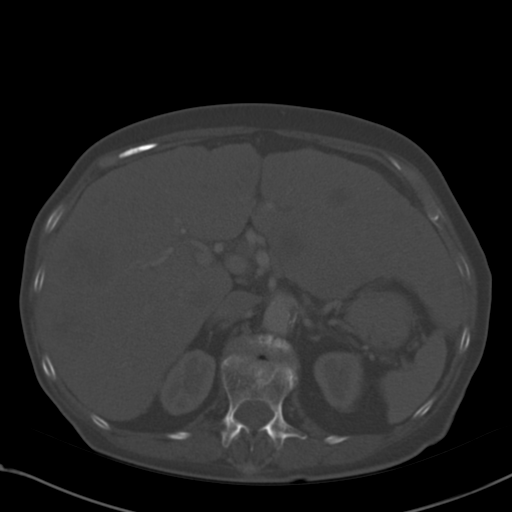
[im 8/60  soft-tissue]
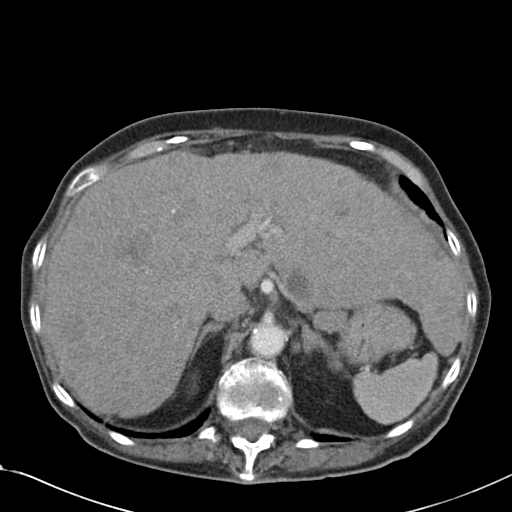
[im 12/60  soft-tissue]
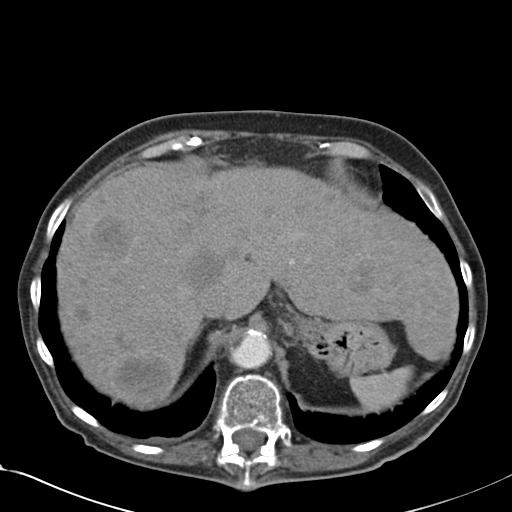
[im 18/60  soft-tissue]
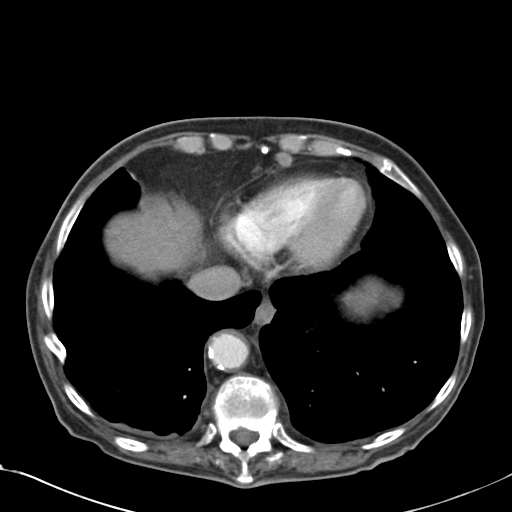
[im 21/60  soft-tissue]
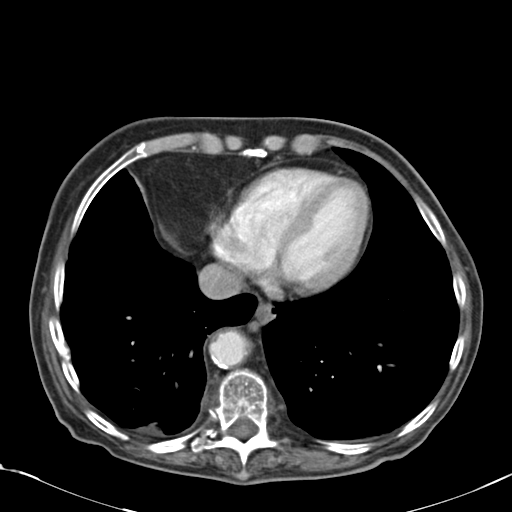
[im 25/60  soft-tissue]
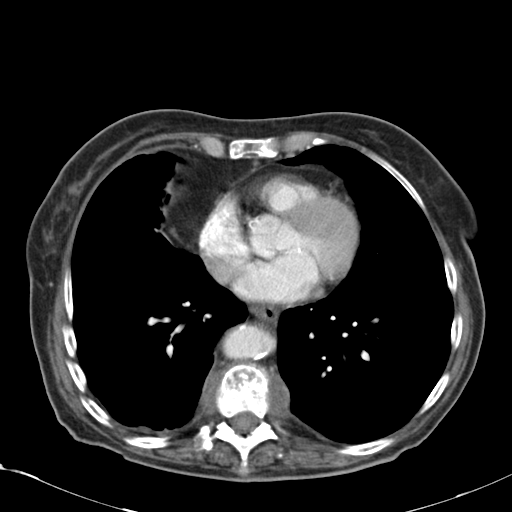
[im 31/60  soft-tissue]
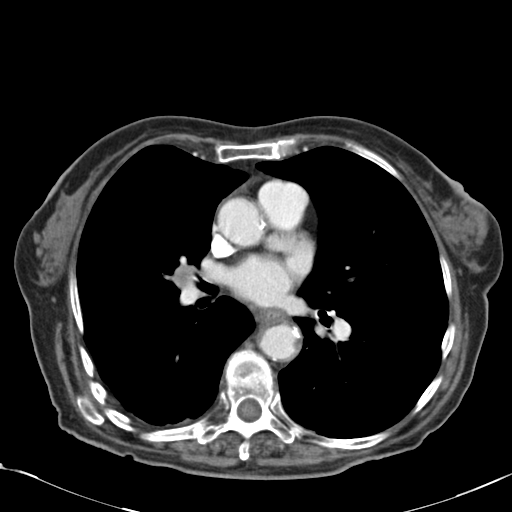
[im 35/60  soft-tissue]
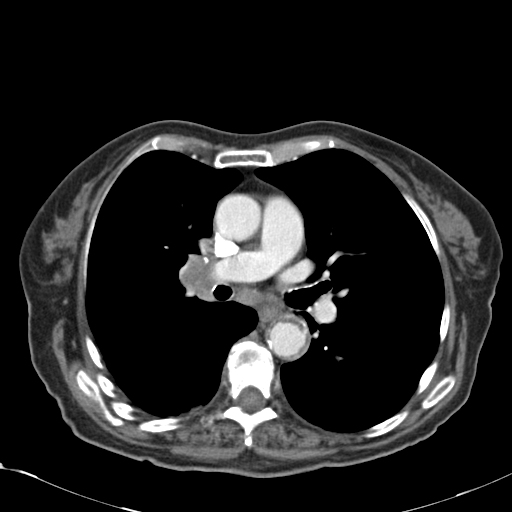
[im 39/60  soft-tissue]
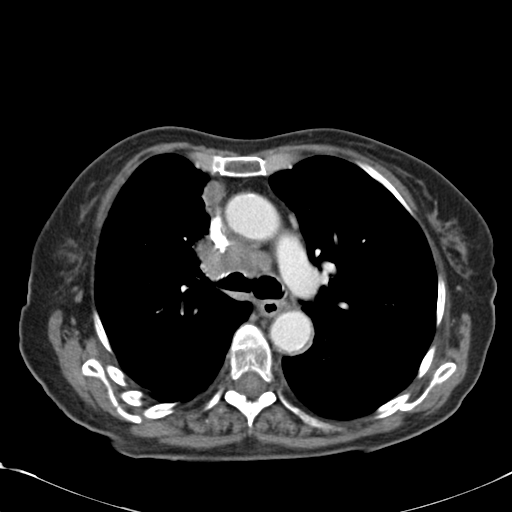
[im 39/60  bone]
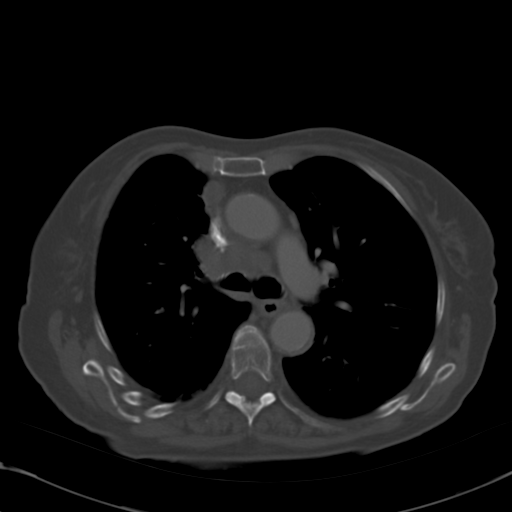
[im 42/60  soft-tissue]
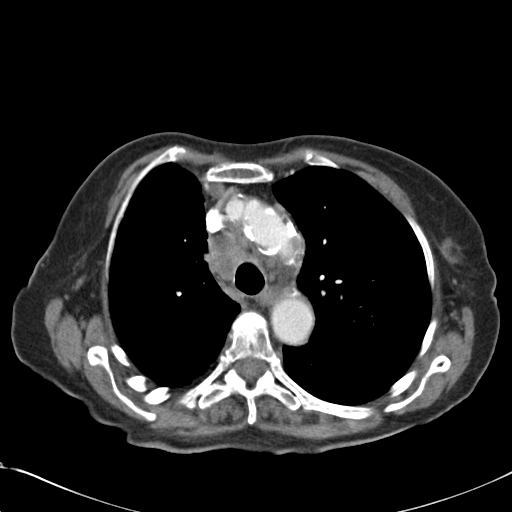
[im 48/60  soft-tissue]
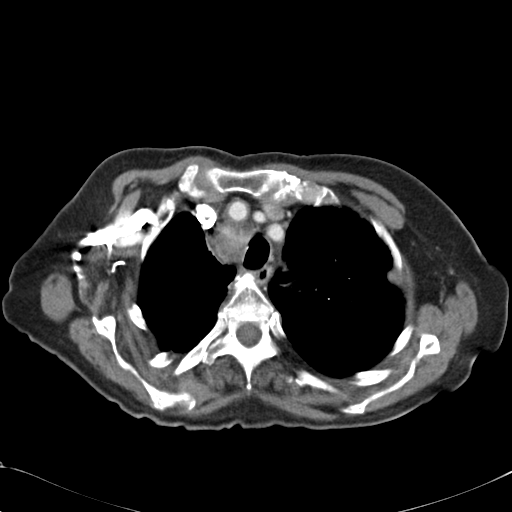
[im 52/60  soft-tissue]
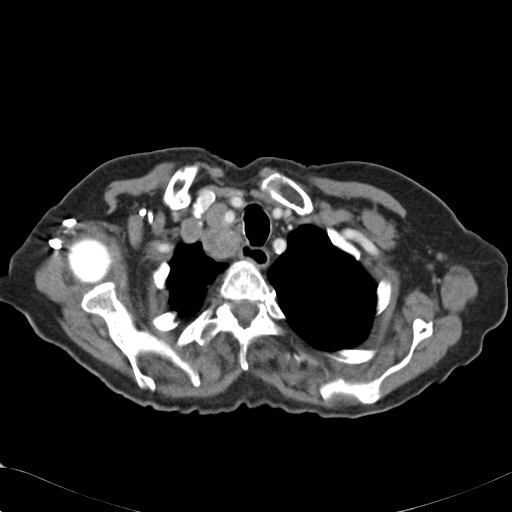
[im 56/60  soft-tissue]
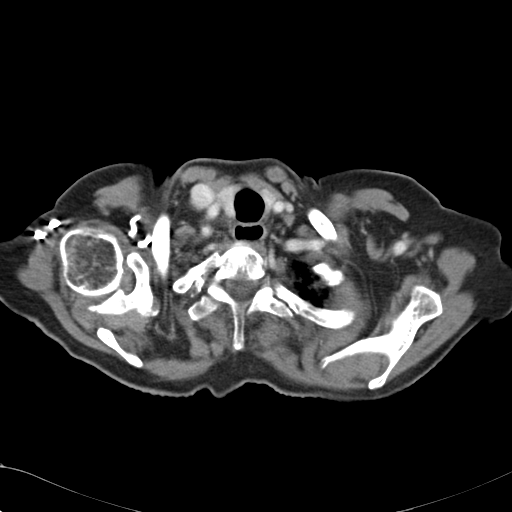

[Series 5: routine chest with cor · coronal · 0.60mm/px · 3 of 111 slices shown]
[im 37/111  soft-tissue]
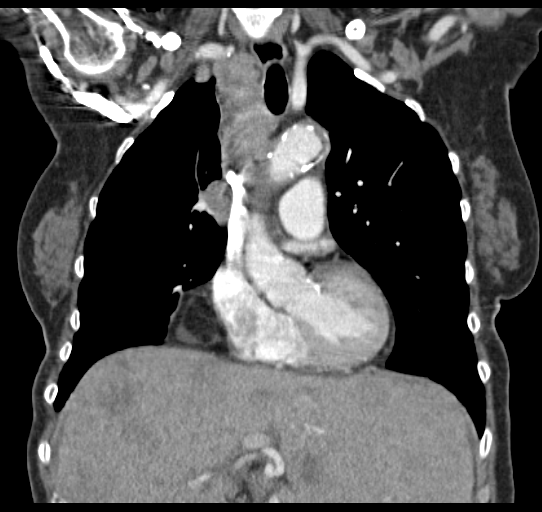
[im 49/111  soft-tissue]
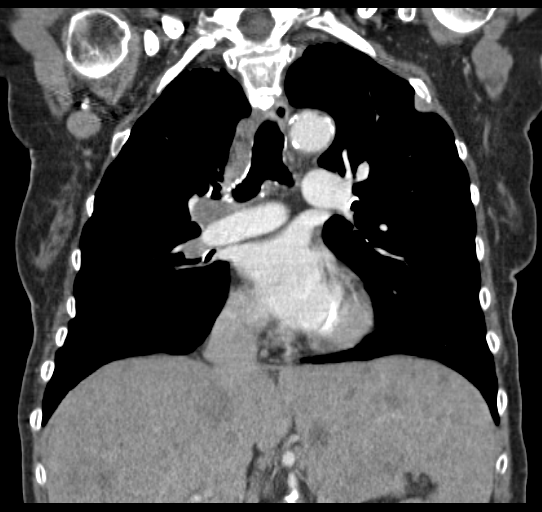
[im 62/111  soft-tissue]
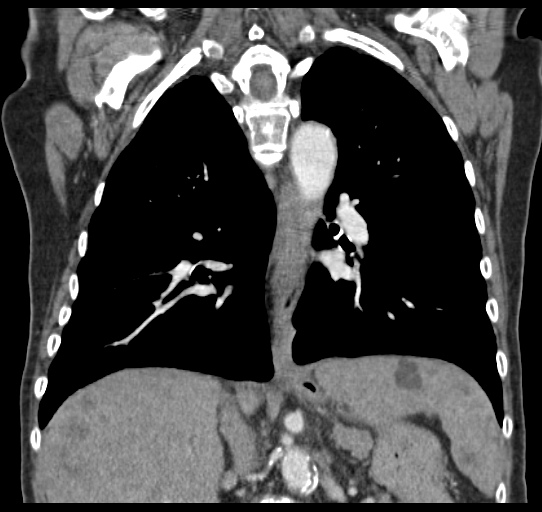

[16 of 46 positions shown; findings below may reference images not displayed]

FINDINGS: Mediastinum/Nodes: RIGHT axial lymph node is increased in size
measuring 15 mm short axis on image 15, series 2 compared to 5 mm.
New RIGHT supraclavicular lymph node measuring 12 mm on image 7
series 2. Bulky high RIGHT paratracheal lymph node measures 21 mm
increased from 6 mm. Similar bulky RIGHT lower paratracheal lymph
node measures 21 mm short axis.

There is RIGHT hilar adenopathy increased from prior.

No central pulmonary embolism. No pericardial fluid. Esophagus
normal.

Lungs/Pleura: Small RIGHT upper lobe nodule measures 6 mm compared
to 4 mm (image 16, series 3). Volume loss the RIGHT hemithorax with
mild nodular pleural thickening at the RIGHT lung base not changed.
LEFT lung is clear

Upper abdomen: There is a progression of hepatic metastasis.
Multiple new and enlarged lesions are present within the LEFT and
RIGHT hepatic lobe. Large lesion in the superior RIGHT hepatic lobe
measures 2.7 cm image 47. Enlarged lesion in the lateral RIGHT
hepatic lobe measures 3.4 cm compared to 1.5. There approximately 20
lesions within each lobe of the liver. The liver has a nodular
contour and is enlarged.

Enlarged gastrohepatic ligament and periportal lymph nodes measuring
13 mm and 17 mm on image 53, series 2

Musculoskeletal: Severe compression fracture of the lower thoracic
spine is not changed. New compression fracture at is T9 with
approximately 20% loss vertebral body height. There is sclerosis
within the vertebral bodies suggest pathologic fracture. Endplate
depressions at T7 and T5 are stable.
IMPRESSION: 1. Marked progression of mediastinal and supraclavicular metastatic
adenopathy on the RIGHT.
2. Newly enlarged metastatic adenopathy to the RIGHT axilla.
3. Stable small pulmonary nodules.
4. Marked progression of hepatic metastasis.
5. New compression deformity at T9 is concerning for pathologic
fracture. Approximately 20% loss height without retropulsion.
